# Patient Record
Sex: Female | Born: 1970 | Race: White | Hispanic: No | Marital: Married | Smoking: Never smoker
Health system: Southern US, Community
[De-identification: ages and names within clinical notes are randomized; demographics above are authoritative.]

## PROBLEM LIST (undated history)

## (undated) DIAGNOSIS — F419 Anxiety disorder, unspecified: Secondary | ICD-10-CM

## (undated) DIAGNOSIS — R45851 Suicidal ideations: Secondary | ICD-10-CM

## (undated) DIAGNOSIS — A64 Unspecified sexually transmitted disease: Secondary | ICD-10-CM

## (undated) DIAGNOSIS — G4700 Insomnia, unspecified: Secondary | ICD-10-CM

## (undated) DIAGNOSIS — R55 Syncope and collapse: Secondary | ICD-10-CM

## (undated) DIAGNOSIS — F329 Major depressive disorder, single episode, unspecified: Secondary | ICD-10-CM

## (undated) HISTORY — DX: Insomnia, unspecified: G47.00

## (undated) HISTORY — DX: Anxiety disorder, unspecified: F41.9

## (undated) HISTORY — DX: Major depressive disorder, single episode, unspecified: F32.9

## (undated) HISTORY — DX: Syncope and collapse: R55

## (undated) HISTORY — DX: Suicidal ideations: R45.851

## (undated) HISTORY — DX: Unspecified sexually transmitted disease: A64

---
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
## 2001-11-10 ENCOUNTER — Encounter: Payer: Self-pay | Admitting: Emergency Medicine

## 2001-11-10 ENCOUNTER — Emergency Department (HOSPITAL_COMMUNITY): Admission: EM | Admit: 2001-11-10 | Discharge: 2001-11-10 | Payer: Self-pay | Admitting: Emergency Medicine

## 2002-02-02 ENCOUNTER — Inpatient Hospital Stay (HOSPITAL_COMMUNITY): Admission: EM | Admit: 2002-02-02 | Discharge: 2002-02-03 | Payer: Self-pay | Admitting: Emergency Medicine

## 2002-02-09 ENCOUNTER — Emergency Department (HOSPITAL_COMMUNITY): Admission: EM | Admit: 2002-02-09 | Discharge: 2002-02-09 | Payer: Self-pay | Admitting: Emergency Medicine

## 2002-06-17 DIAGNOSIS — R45851 Suicidal ideations: Secondary | ICD-10-CM

## 2002-06-17 DIAGNOSIS — F32A Depression, unspecified: Secondary | ICD-10-CM

## 2002-06-17 HISTORY — DX: Depression, unspecified: F32.A

## 2002-06-17 HISTORY — DX: Suicidal ideations: R45.851

## 2003-06-18 HISTORY — PX: ABDOMINAL HYSTERECTOMY: SHX81

## 2006-04-24 ENCOUNTER — Ambulatory Visit: Payer: Self-pay | Admitting: Internal Medicine

## 2006-05-15 ENCOUNTER — Ambulatory Visit: Payer: Self-pay | Admitting: Internal Medicine

## 2006-08-26 ENCOUNTER — Ambulatory Visit: Payer: Self-pay | Admitting: Internal Medicine

## 2006-08-28 ENCOUNTER — Ambulatory Visit: Payer: Self-pay | Admitting: Internal Medicine

## 2006-12-04 DIAGNOSIS — F39 Unspecified mood [affective] disorder: Secondary | ICD-10-CM | POA: Insufficient documentation

## 2006-12-04 DIAGNOSIS — F419 Anxiety disorder, unspecified: Secondary | ICD-10-CM

## 2006-12-11 ENCOUNTER — Telehealth (INDEPENDENT_AMBULATORY_CARE_PROVIDER_SITE_OTHER): Payer: Self-pay | Admitting: *Deleted

## 2007-03-25 ENCOUNTER — Encounter: Payer: Self-pay | Admitting: Family Medicine

## 2007-03-25 ENCOUNTER — Telehealth (INDEPENDENT_AMBULATORY_CARE_PROVIDER_SITE_OTHER): Payer: Self-pay | Admitting: *Deleted

## 2007-03-25 ENCOUNTER — Telehealth: Payer: Self-pay | Admitting: Family Medicine

## 2007-04-23 ENCOUNTER — Telehealth: Payer: Self-pay | Admitting: Family Medicine

## 2007-05-18 ENCOUNTER — Telehealth (INDEPENDENT_AMBULATORY_CARE_PROVIDER_SITE_OTHER): Payer: Self-pay | Admitting: *Deleted

## 2007-07-13 ENCOUNTER — Telehealth: Payer: Self-pay | Admitting: Family Medicine

## 2007-07-15 ENCOUNTER — Telehealth (INDEPENDENT_AMBULATORY_CARE_PROVIDER_SITE_OTHER): Payer: Self-pay | Admitting: *Deleted

## 2007-07-20 ENCOUNTER — Telehealth (INDEPENDENT_AMBULATORY_CARE_PROVIDER_SITE_OTHER): Payer: Self-pay | Admitting: *Deleted

## 2007-07-29 ENCOUNTER — Ambulatory Visit: Payer: Self-pay | Admitting: Internal Medicine

## 2007-07-29 DIAGNOSIS — F4321 Adjustment disorder with depressed mood: Secondary | ICD-10-CM

## 2007-08-25 ENCOUNTER — Telehealth: Payer: Self-pay | Admitting: Internal Medicine

## 2007-09-21 ENCOUNTER — Telehealth (INDEPENDENT_AMBULATORY_CARE_PROVIDER_SITE_OTHER): Payer: Self-pay | Admitting: *Deleted

## 2007-10-08 ENCOUNTER — Encounter (INDEPENDENT_AMBULATORY_CARE_PROVIDER_SITE_OTHER): Payer: Self-pay | Admitting: *Deleted

## 2007-10-20 ENCOUNTER — Telehealth (INDEPENDENT_AMBULATORY_CARE_PROVIDER_SITE_OTHER): Payer: Self-pay | Admitting: *Deleted

## 2007-11-17 ENCOUNTER — Telehealth (INDEPENDENT_AMBULATORY_CARE_PROVIDER_SITE_OTHER): Payer: Self-pay | Admitting: *Deleted

## 2007-12-04 ENCOUNTER — Telehealth: Payer: Self-pay | Admitting: Internal Medicine

## 2007-12-14 ENCOUNTER — Telehealth: Payer: Self-pay | Admitting: Family Medicine

## 2008-01-12 ENCOUNTER — Telehealth (INDEPENDENT_AMBULATORY_CARE_PROVIDER_SITE_OTHER): Payer: Self-pay | Admitting: *Deleted

## 2008-02-12 ENCOUNTER — Ambulatory Visit: Payer: Self-pay | Admitting: Internal Medicine

## 2008-02-12 ENCOUNTER — Telehealth (INDEPENDENT_AMBULATORY_CARE_PROVIDER_SITE_OTHER): Payer: Self-pay | Admitting: *Deleted

## 2008-02-29 ENCOUNTER — Ambulatory Visit: Payer: Self-pay | Admitting: Family Medicine

## 2008-02-29 DIAGNOSIS — H10029 Other mucopurulent conjunctivitis, unspecified eye: Secondary | ICD-10-CM

## 2008-03-01 ENCOUNTER — Telehealth: Payer: Self-pay | Admitting: Family Medicine

## 2008-03-08 ENCOUNTER — Telehealth: Payer: Self-pay | Admitting: Family Medicine

## 2008-03-08 ENCOUNTER — Ambulatory Visit: Payer: Self-pay

## 2008-04-08 ENCOUNTER — Telehealth: Payer: Self-pay | Admitting: Internal Medicine

## 2008-05-06 ENCOUNTER — Telehealth: Payer: Self-pay | Admitting: Internal Medicine

## 2008-05-20 ENCOUNTER — Ambulatory Visit: Payer: Self-pay | Admitting: Internal Medicine

## 2008-06-06 ENCOUNTER — Telehealth: Payer: Self-pay | Admitting: Internal Medicine

## 2008-06-20 ENCOUNTER — Ambulatory Visit: Payer: Self-pay | Admitting: Family Medicine

## 2008-06-20 DIAGNOSIS — J069 Acute upper respiratory infection, unspecified: Secondary | ICD-10-CM | POA: Insufficient documentation

## 2008-07-06 ENCOUNTER — Telehealth: Payer: Self-pay | Admitting: Internal Medicine

## 2008-07-06 ENCOUNTER — Ambulatory Visit: Payer: Self-pay | Admitting: Family Medicine

## 2008-07-06 DIAGNOSIS — L259 Unspecified contact dermatitis, unspecified cause: Secondary | ICD-10-CM

## 2008-08-05 ENCOUNTER — Telehealth (INDEPENDENT_AMBULATORY_CARE_PROVIDER_SITE_OTHER): Payer: Self-pay | Admitting: Internal Medicine

## 2008-08-05 ENCOUNTER — Telehealth: Payer: Self-pay | Admitting: Family Medicine

## 2008-08-11 ENCOUNTER — Telehealth: Payer: Self-pay | Admitting: Internal Medicine

## 2008-09-02 ENCOUNTER — Telehealth: Payer: Self-pay | Admitting: Internal Medicine

## 2008-09-29 ENCOUNTER — Telehealth: Payer: Self-pay | Admitting: Internal Medicine

## 2008-10-14 ENCOUNTER — Telehealth: Payer: Self-pay | Admitting: Internal Medicine

## 2008-11-09 ENCOUNTER — Telehealth: Payer: Self-pay | Admitting: Internal Medicine

## 2008-11-21 ENCOUNTER — Ambulatory Visit: Payer: Self-pay | Admitting: Internal Medicine

## 2008-12-09 ENCOUNTER — Telehealth (INDEPENDENT_AMBULATORY_CARE_PROVIDER_SITE_OTHER): Payer: Self-pay | Admitting: *Deleted

## 2009-02-01 ENCOUNTER — Telehealth: Payer: Self-pay | Admitting: Internal Medicine

## 2009-02-21 ENCOUNTER — Encounter: Payer: Self-pay | Admitting: Internal Medicine

## 2009-02-28 ENCOUNTER — Telehealth: Payer: Self-pay | Admitting: Internal Medicine

## 2009-04-05 ENCOUNTER — Telehealth: Payer: Self-pay | Admitting: Internal Medicine

## 2009-04-07 ENCOUNTER — Emergency Department: Payer: Self-pay | Admitting: Emergency Medicine

## 2009-04-14 ENCOUNTER — Ambulatory Visit: Payer: Self-pay | Admitting: Family Medicine

## 2009-04-14 ENCOUNTER — Ambulatory Visit: Payer: Self-pay | Admitting: Internal Medicine

## 2009-04-14 DIAGNOSIS — J209 Acute bronchitis, unspecified: Secondary | ICD-10-CM | POA: Insufficient documentation

## 2009-04-14 DIAGNOSIS — R05 Cough: Secondary | ICD-10-CM

## 2009-04-17 ENCOUNTER — Telehealth: Payer: Self-pay | Admitting: Family Medicine

## 2009-05-05 ENCOUNTER — Telehealth: Payer: Self-pay | Admitting: Internal Medicine

## 2009-05-31 ENCOUNTER — Ambulatory Visit: Payer: Self-pay | Admitting: Internal Medicine

## 2009-06-02 LAB — CONVERTED CEMR LAB
ALT: 16 units/L (ref 0–35)
AST: 26 units/L (ref 0–37)
Albumin: 4.2 g/dL (ref 3.5–5.2)
Alkaline Phosphatase: 65 units/L (ref 39–117)
BUN: 10 mg/dL (ref 6–23)
Basophils Absolute: 0 10*3/uL (ref 0.0–0.1)
Basophils Relative: 0.8 % (ref 0.0–3.0)
Bilirubin, Direct: 0 mg/dL (ref 0.0–0.3)
CO2: 31 meq/L (ref 19–32)
Calcium: 9.8 mg/dL (ref 8.4–10.5)
Chloride: 102 meq/L (ref 96–112)
Creatinine, Ser: 1 mg/dL (ref 0.4–1.2)
Eosinophils Absolute: 0.2 10*3/uL (ref 0.0–0.7)
Eosinophils Relative: 4.6 % (ref 0.0–5.0)
GFR calc non Af Amer: 65.47 mL/min (ref >60)
Glucose, Bld: 81 mg/dL (ref 70–99)
HCT: 38.2 % (ref 36.0–46.0)
Hemoglobin: 12.9 g/dL (ref 12.0–15.0)
Lymphocytes Relative: 28.8 % (ref 12.0–46.0)
Lymphs Abs: 1.5 10*3/uL (ref 0.7–4.0)
MCHC: 33.8 g/dL (ref 30.0–36.0)
MCV: 95.3 fL (ref 78.0–100.0)
Monocytes Absolute: 0.4 10*3/uL (ref 0.1–1.0)
Monocytes Relative: 8.6 % (ref 3.0–12.0)
Neutro Abs: 3.1 10*3/uL (ref 1.4–7.7)
Neutrophils Relative %: 57.2 % (ref 43.0–77.0)
Phosphorus: 4.4 mg/dL (ref 2.3–4.6)
Platelets: 266 10*3/uL (ref 150.0–400.0)
Potassium: 4.1 meq/L (ref 3.5–5.1)
RBC: 4 M/uL (ref 3.87–5.11)
RDW: 12.1 % (ref 11.5–14.6)
Sodium: 139 meq/L (ref 135–145)
TSH: 1.11 microintl units/mL (ref 0.35–5.50)
Total Bilirubin: 0.8 mg/dL (ref 0.3–1.2)
Total Protein: 6.9 g/dL (ref 6.0–8.3)
WBC: 5.2 10*3/uL (ref 4.5–10.5)

## 2009-06-30 ENCOUNTER — Telehealth: Payer: Self-pay | Admitting: Internal Medicine

## 2009-08-28 ENCOUNTER — Telehealth: Payer: Self-pay | Admitting: Internal Medicine

## 2009-11-15 ENCOUNTER — Telehealth: Payer: Self-pay | Admitting: Internal Medicine

## 2010-02-05 ENCOUNTER — Telehealth: Payer: Self-pay | Admitting: Internal Medicine

## 2010-02-11 ENCOUNTER — Emergency Department: Payer: Self-pay | Admitting: Emergency Medicine

## 2010-02-28 ENCOUNTER — Ambulatory Visit: Payer: Self-pay | Admitting: Internal Medicine

## 2010-03-19 ENCOUNTER — Telehealth: Payer: Self-pay | Admitting: Internal Medicine

## 2010-04-02 ENCOUNTER — Ambulatory Visit: Payer: Self-pay | Admitting: Internal Medicine

## 2010-05-01 ENCOUNTER — Telehealth: Payer: Self-pay | Admitting: Internal Medicine

## 2010-05-28 ENCOUNTER — Telehealth: Payer: Self-pay | Admitting: Internal Medicine

## 2010-06-26 ENCOUNTER — Telehealth: Payer: Self-pay | Admitting: Internal Medicine

## 2010-07-05 ENCOUNTER — Ambulatory Visit: Admit: 2010-07-05 | Payer: Self-pay | Admitting: Internal Medicine

## 2010-07-13 ENCOUNTER — Ambulatory Visit: Admit: 2010-07-13 | Payer: Self-pay | Admitting: Internal Medicine

## 2010-07-17 NOTE — Assessment & Plan Note (Signed)
Summary: F/U,REFILL MEDS/CLE   Vital Signs:  Patient profile:   40 year old female Weight:      132 pounds BMI:     22.05 Temp:     98.4 degrees F oral Pulse rate:   68 / minute Pulse rhythm:   regular BP sitting:   110 / 78  (left arm) Cuff size:   regular  Vitals Entered By: Mervin Hack CMA Duncan Dull) (February 28, 2010 12:44 PM) CC: follow-up visit   History of Present Illness: "I think I may need some help"  Has gotten in with some people and started coke Started about 7 months ago No crack Using once or twice a week  Husband and children know Now can't see grandchildren without supervision Husband has cut off her access to money Not spending time with those poeple anymore  Mood otherwise was okay Not necessarily medicating a worsening of depression but feels like when she is high she doesn't have to worry about anything  still on fluoxetine xanax two times a day   No suicidal thoughts "I just feel like I want to go away"  Allergies: No Known Drug Allergies  Past History:  Past medical, surgical, family and social histories (including risk factors) reviewed for relevance to current acute and chronic problems.  Past Medical History: Reviewed history from 12/04/2006 and no changes required. Anxiety  1990 Depression 2004  Past Surgical History: Reviewed history from 12/04/2006 and no changes required. 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 Father died @82  Bladder cancer.  Had throat & prostate cancer also Mother: died @42  CVA x 2, HTN, obesity Siblings:  one brother No breast or colon cancer Mat GM-lung cancer  Social History: Reviewed history from 05/31/2009 and no changes required. Marital Status: Married Children: 2 daughters (before marriage--husband adopted) Unemployed at present Never Smoked Alcohol use-no  Physical  Exam  General:  alert.  NAD Psych:  dysphoric affect, tearful, and slightly anxious.     Impression & Recommendations:  Problem # 1:  DEPRESSION (ICD-311) Assessment Deteriorated  mood worse but doesn't seem like this prompted using the cocaine May be having a withdrawl effect that should ease as she abstains Needs help dealing with this will set up with Dr Laymond Purser No change in meds for now 25 minute visit--over half in counselling  Her updated medication list for this problem includes:    Alprazolam 1 Mg Tabs (Alprazolam) .Marland Kitchen... 1/2 to 1 two times a day as needed    Fluoxetine Hcl 20 Mg Caps (Fluoxetine hcl) .Marland Kitchen... 1 daily  Orders: Psychology Referral (Psychology)  Problem # 2:  ANXIETY (ICD-300.00) Assessment: Deteriorated will fill xanax today may need extra as she gives up the cocaine  Her updated medication list for this problem includes:    Alprazolam 1 Mg Tabs (Alprazolam) .Marland Kitchen... 1/2 to 1 two times a day as needed    Fluoxetine Hcl 20 Mg Caps (Fluoxetine hcl) .Marland Kitchen... 1 daily  Complete Medication List: 1)  Alprazolam 1 Mg Tabs (Alprazolam) .... 1/2 to 1 two times a day as needed 2)  Fluoxetine Hcl 20 Mg Caps (Fluoxetine hcl) .Marland Kitchen.. 1 daily  Patient Instructions: 1)  Please schedule a follow-up appointment in 3-4 weeks.  2)  Referral Appointment Information 3)  Day/Date: 4)  Time: 5)  Place/MD: 6)  Address: 7)  Phone/Fax: 8)  Patient given appointment information. Information/Orders faxed/mailed. Prescriptions: ALPRAZOLAM 1  MG  TABS (ALPRAZOLAM) 1/2 to 1 two times a day as needed  #60 x 0   Entered and Authorized by:   Cindee Salt MD   Signed by:   Cindee Salt MD on 02/28/2010   Method used:   Print then Give to Patient   RxID:   [id number]   Current Allergies (reviewed today): No known allergies   Appended Document: F/U,REFILL MEDS/CLE called in rx for XANAX to [facility] [address], Dr.Letvak auth early refill

## 2010-07-17 NOTE — Assessment & Plan Note (Signed)
Summary: f/u on med/alc   Vital Signs:  Patient profile:   40 year old female Weight:      142 pounds Temp:     98.1 degrees F oral BP sitting:   108 / 70  (left arm) Cuff size:   regular  Vitals Entered By: Mervin Hack CMA [facility] Dull) (April 02, 2010 10:08 AM) CC: follow-up visit   History of Present Illness: Doing okay Has not done any cocaine since the last visit "It has not been as hard to not do anything as I thought" Has stayed away from the "friends" that she was doing the drugs with Family is supportive---now can have her grandkids overnight Husband has still ben angry and worried that she is still doing the cocaine  Didn't see Dr Donnal Debar insurance and wanted to see if she could stop on her own Mood has been okay  Hasn't been able to sleep  has had to use 2 xanax at night to sleep "I can't wind down"---too much on her mind and was used to staying up late when using cocaine 1-2 times per week  Still using the xanax regularly two times a day    Allergies (verified): 1)  ! Vicodin (Hydrocodone-Acetaminophen) 2)  ! Percocet (Oxycodone-Acetaminophen)  Past History:  Past medical, surgical, family and social histories (including risk factors) reviewed for relevance to current acute and chronic problems.  Past Medical History: Reviewed history from 12/04/2006 and no changes required. Anxiety  1990 Depression 2004  Past Surgical History: Reviewed history from 12/04/2006 and no changes required. 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 Father died @82  Bladder cancer.  Had throat & prostate cancer also Mother: died @42  CVA x 2, HTN, obesity Siblings:  one brother No breast or colon cancer Mat GM-lung cancer  Social History: Reviewed history from 05/31/2009 and no changes required. Marital Status: Married Children: 2 daughters (before  marriage--husband adopted) Unemployed at present Never Smoked Alcohol use-no  Review of Systems       appetite is about the same weight is up 10#  Physical Exam  General:  alert and normal appearance.   Psych:  normally interactive, good eye contact, not anxious appearing, and not depressed appearing.     Impression & Recommendations:  Problem # 1:  ANXIETY (ICD-300.00) Assessment Improved has been overusing the xanax to be able to sleep  P: use xanax only in day     add trazodone      has been off cocaine since last visit  Her updated medication list for this problem includes:    Alprazolam 1 Mg Tabs (Alprazolam) .Marland Kitchen... 1/2 to 1 two times a day as needed    Fluoxetine Hcl 20 Mg Caps (Fluoxetine hcl) .Marland Kitchen... 1 daily    Trazodone Hcl 100 Mg Tabs (Trazodone hcl) .Marland Kitchen... 1 tab at bedtime to help sleep  Complete Medication List: 1)  Alprazolam 1 Mg Tabs (Alprazolam) .... 1/2 to 1 two times a day as needed 2)  Fluoxetine Hcl 20 Mg Caps (Fluoxetine hcl) .Marland Kitchen.. 1 daily 3)  Trazodone Hcl 100 Mg Tabs (Trazodone hcl) .Marland Kitchen.. 1 tab at bedtime to help sleep  Patient Instructions: 1)  Please try 1/2 of a trazodone tonight and tomorrow night. If not sleeping well then, increase to a full tab at bedtime. If still not sleeping after another 2-3 nights, may increase to 1 & 1/2 tabs at bedtime 2)  Please schedule a follow-up appointment in 3 months .  Prescriptions: TRAZODONE HCL 100 MG TABS (TRAZODONE HCL) 1 tab at bedtime to help sleep  #30 x 3   Entered and Authorized by:   Cindee Salt MD   Signed by:   Cindee Salt MD on 04/02/2010   Method used:   Electronically to        [facility] [address])       [address]       [place], [place]  [address]       Ph: [phone]       Fax: [phone]   RxID:   [id number] ALPRAZOLAM 1 MG  TABS (ALPRAZOLAM) 1/2 to 1 two times a day as needed MAY FILL EARLY NOW  #60 x 0   Entered  and Authorized by:   Cindee Salt MD   Signed by:   Cindee Salt MD on 04/02/2010   Method used:   Print then Give to Patient   RxID:   [id number]    Orders Added: 1)  Est. Patient Level III [38756]    Current Allergies (reviewed today): ! VICODIN (HYDROCODONE-ACETAMINOPHEN) ! PERCOCET (OXYCODONE-ACETAMINOPHEN)

## 2010-07-17 NOTE — Progress Notes (Signed)
Summary: ALPRAZOLAM  Phone Note Refill Request Message from:  [phone] on August 28, 2009 9:06 AM  Refills Requested: Medication #1:  ALPRAZOLAM 1 MG  TABS 1/2 to 1 two times a day as needed   Last Refilled: 07/30/2009 E-Scribe Request    Method Requested: Telephone to Pharmacy Initial call taken by: Mervin Hack CMA Duncan Dull),  August 28, 2009 9:06 AM  Follow-up for Phone Call        okay #60 x 2 Follow-up by: Cindee Salt MD,  August 28, 2009 1:37 PM  Additional Follow-up for Phone Call Additional follow up Details #1::        Rx called to pharmacy Additional Follow-up by: Mervin Hack CMA [facility] Dull),  August 28, 2009 2:48 PM    Prescriptions: ALPRAZOLAM 1 MG  TABS (ALPRAZOLAM) 1/2 to 1 two times a day as needed  #60 x 2   Entered by:   Mervin Hack CMA (AAMA)   Authorized by:   Cindee Salt MD   Signed by:   Mervin Hack CMA (AAMA) on 08/28/2009   Method used:   Telephoned to ...       [facility] [address]       [address]       [place], [place]  [address]       Ph: [phone]       Fax: [phone]   RxID:   [id number]

## 2010-07-17 NOTE — Progress Notes (Signed)
Summary: ALPRAZOLAM   Phone Note [facility]ill Request [phone] on February 05, 2010 9:16 AM  Refills Requested: Medication #1:  ALPRAZOLAM 1 MG  TABS 1/2 to 1 two times a day as needed   Last Refilled: 01/08/2010 E-Scribe Request    Method Requested: Telephone to Pharmacy Initial call taken by: Mervin Hack CMA Duncan Dull),  February 05, 2010 9:17 AM  Follow-up for Phone Call        okay #60 x 0 she needs to schedule a follow up appt Follow-up by: Cindee Salt MD,  February 05, 2010 1:40 PM  Additional Follow-up for Phone Call Additional follow up Details #1::        Rx called to pharmacy, [facility] at home that pt would need schedule follow-up for further refills Additional Follow-up by: DeShannon Katrinka Blazing CMA [facility] Dull),  February 05, 2010 3:18 PM    Prescriptions: ALPRAZOLAM 1 MG  TABS (ALPRAZOLAM) 1/2 to 1 two times a day as needed  #60 x 0   Entered by:   Mervin Hack CMA (AAMA)   Authorized by:   Cindee Salt MD   Signed by:   Mervin Hack CMA (AAMA) on 02/05/2010   Method used:   Telephoned to ...       [facility] [address]       [address]       [place], [place]  [address]       Ph: [phone]       Fax: [phone]   RxID:   [id number]

## 2010-07-17 NOTE — Progress Notes (Signed)
Summary: ALPRAZOLAM  Phone Note Refill Request Message from:  Patient on March 19, 2010 11:08 AM  Refills Requested: Medication #1:  ALPRAZOLAM 1 MG  TABS 1/2 to 1 two times a day as needed   Last Refilled: 02/28/2010 Pt calling asking for early refill on rx, pt has appt on Wednesday. Please advise, rx should be sent to [id number]   Method Requested: Telephone to Pharmacy Initial call taken by: DeShannon Katrinka Blazing CMA Duncan Dull),  March 19, 2010 11:09 AM  Follow-up for Phone Call        Okay #60 x 0 Make sure she is coming in for the follow up appt Follow-up by: Cindee Salt MD,  March 19, 2010 1:43 PM  Additional Follow-up for Phone Call Additional follow up Details #1::        Rx called to pharmacy, pt states she's coming Additional Follow-up by: Mervin Hack CMA Duncan Dull),  March 19, 2010 3:16 PM    Prescriptions: ALPRAZOLAM 1 MG  TABS (ALPRAZOLAM) 1/2 to 1 two times a day as needed  #60 x 0   Entered by:   Mervin Hack CMA (AAMA)   Authorized by:   Cindee Salt MD   Signed by:   Mervin Hack CMA (AAMA) on 03/19/2010   Method used:   Telephoned to ...       [facility] [address]       [address]       [place], [place]  [address]       Ph: [phone]       Fax: [phone]   RxID:   [id number]

## 2010-07-17 NOTE — Progress Notes (Signed)
Summary:  ALPRAZOLAM  Phone Note [facility]ill [facility]  [phone] on November 15, 2009 9:12 AM  Refills Requested: Medication #1:  ALPRAZOLAM 1 MG  TABS 1/2 to 1 two times a day as needed   Last Refilled: 10/20/2009 E-Scribe Request    Method Requested: Telephone to Pharmacy Initial call taken by: Mervin Hack CMA Duncan Dull),  November 15, 2009 9:12 AM  Follow-up for Phone Call        okay #60 x 2 Follow-up by: Cindee Salt MD,  November 15, 2009 11:58 AM  Additional Follow-up for Phone Call Additional follow up Details #1::        Rx called to pharmacy Additional Follow-up by: DeShannon Smith CMA Duncan Dull),  November 15, 2009 12:55 PM    Prescriptions: ALPRAZOLAM 1 MG  TABS (ALPRAZOLAM) 1/2 to 1 two times a day as needed  #60 x 2   Entered by:   Mervin Hack CMA (AAMA)   Authorized by:   Cindee Salt MD   Signed by:   Mervin Hack CMA (AAMA) on 11/15/2009   Method used:   Telephoned to ...       [facility] [address]       [address]       [place], [place]  [address]       Ph: [phone]       Fax: [phone]   RxID:   [id number]

## 2010-07-17 NOTE — Progress Notes (Signed)
Summary:  ALPRAZOLAM  Phone Note [facility]ill Request [facility]:  [phone] on June 30, 2009 5:18 PM  Refills Requested: Medication #1:  ALPRAZOLAM 1 MG  TABS 1/2 to 1 two times a day as needed   Last Refilled: 06/02/2009 E-Scribe Request    Method Requested: Telephone to Pharmacy Initial call taken by: Mervin Hack CMA Duncan Dull),  June 30, 2009 5:19 PM  Follow-up for Phone Call        she got 2 refills 1 month ago  please check with the pharmacy Follow-up by: Cindee Salt MD,  June 30, 2009 5:32 PM  Additional Follow-up for Phone Call Additional follow up Details #1::        spoke with pharmacist and pt does have refills Additional Follow-up by: Mervin Hack CMA Duncan Dull),  June 30, 2009 6:01 PM    Additional Follow-up for Phone Call Additional follow up Details #2::    noted Follow-up by: Cindee Salt MD,  June 30, 2009 7:05 PM

## 2010-07-17 NOTE — Progress Notes (Signed)
Summary:  ALPRAZOLAM   Phone Note Refill Request Message from:  [facility] [phone] on May 01, 2010 11:19 AM  Refills Requested: Medication #1:  ALPRAZOLAM 1 MG  TABS 1/2 to 1 two times a day as needed   Last Refilled: 04/02/2010 E-Scribe Request    Method Requested: Telephone to Pharmacy Initial call taken by: Mervin Hack CMA Duncan Dull),  May 01, 2010 11:19 AM  Follow-up for Phone Call        okay #60 x 0 Follow-up by: Cindee Salt MD,  May 01, 2010 1:47 PM  Additional Follow-up for Phone Call Additional follow up Details #1::        Rx called to pharmacy Additional Follow-up by: DeShannon Katrinka Blazing CMA Duncan Dull),  May 01, 2010 4:09 PM    Prescriptions: ALPRAZOLAM 1 MG  TABS (ALPRAZOLAM) 1/2 to 1 two times a day as needed  #60 x 0   Entered by:   Mervin Hack CMA (AAMA)   Authorized by:   Cindee Salt MD   Signed by:   Mervin Hack CMA (AAMA) on 05/01/2010   Method used:   Telephoned to ...       [facility] [address]       [address]       [place], [place]  [address]       Ph: [phone]       Fax: [phone]   RxID:   [id number]

## 2010-07-17 NOTE — Letter (Signed)
Summary: Work Dietitian at [employer]  [address] [place], [place] [address]   Phone: [phone]  Fax: [phone]    Today's Date: February 28, 2010  Name of Patient: Alexis Pierce  The above named patient had a medical visit today at: 12:15 pm.  Please take this into consideration when reviewing the time away from work.  Special Instructions:  [ X ] None  [  ] To be off the remainder of today, returning to the normal work / school schedule tomorrow.  [  ] To be off until the next scheduled appointment on ______________________.  [  ] Other ________________________________________________________________ ________________________________________________________________________   Sincerely yours,     Tillman Abide, MD

## 2010-07-17 NOTE — Progress Notes (Signed)
Summary: OK to use same bottle of eye drops for both eyes?  Phone Note Call from Patient Call back at Home Phone [phone] Call back at [phone]   Caller: Patient Call For: dr copeland Summary of Call: Pt was seen for conjuctivitis, now she says it is in her right eye.  Has the medicine, asks if ok to use in both  eyes Initial call taken by: Lowella Petties,  March 01, 2008 2:01 PM  Follow-up for Phone Call        yes. OK for both eyes, not uncommon to spread to other one. Follow-up by: Hannah Beat MD,  March 01, 2008 2:10 PM  Additional Follow-up for Phone Call Additional follow up Details #1::        advised pt Additional Follow-up by: Lowella Petties,  March 01, 2008 2:16 PM

## 2010-07-19 NOTE — Progress Notes (Signed)
Summary: ALPRAZOLAM  Phone Note [facility]  [phone] on May 28, 2010 11:57 AM  Refills Requested: Medication #1:  ALPRAZOLAM 1 MG  TABS 1/2 to 1 two times a day as needed   Last Refilled: 05/01/2010 E-Scribe Request    Method Requested: Telephone to Pharmacy Initial call taken by: Mervin Hack CMA Duncan Dull),  May 28, 2010 11:57 AM  Follow-up for Phone Call        okay #60 x 0 Follow-up by: Cindee Salt MD,  May 28, 2010 1:19 PM  Additional Follow-up for Phone Call Additional follow up Details #1::        Rx called to pharmacy Additional Follow-up by: Mervin Hack CMA Duncan Dull),  May 28, 2010 2:23 PM    Prescriptions: ALPRAZOLAM 1 MG  TABS (ALPRAZOLAM) 1/2 to 1 two times a day as needed  #60 x 0   Entered by:   Mervin Hack CMA (AAMA)   Authorized by:   Cindee Salt MD   Signed by:   Mervin Hack CMA (AAMA) on 05/28/2010   Method used:   Telephoned to ...       [facility] [address]       [address]       [place], [place]  [address]       Ph: [phone]       Fax: [phone]   RxID:   [id number]

## 2010-07-19 NOTE — Progress Notes (Signed)
Summary: ALPRAZOLAM/ FLUOXETINE  Phone Note [facility]ill Request [facility]  [phone] on June 26, 2010 4:05 PM  Refills Requested: Medication #1:  ALPRAZOLAM 1 MG  TABS 1/2 to 1 two times a day as needed   Last Refilled: 10/20/2009  Medication #2:  FLUOXETINE HCL 20 MG CAPS 1 daily   Last Refilled: 05/28/2010 E-Scribe Request    Method Requested: Telephone to Pharmacy Initial call taken by: Mervin Hack CMA Duncan Dull),  June 26, 2010 4:13 PM  Follow-up for Phone Call        okay fluoxetine for 1 year  xanax #60 x 0 Follow-up by: Cindee Salt MD,  June 27, 2010 1:39 PM  Additional Follow-up for Phone Call Additional follow up Details #1::        Rx called to pharmacy, Rx faxed to pharmacy Additional Follow-up by: DeShannon Katrinka Blazing CMA Duncan Dull),  June 27, 2010 3:27 PM    Prescriptions: ALPRAZOLAM 1 MG  TABS (ALPRAZOLAM) 1/2 to 1 two times a day as needed  #60 x 0   Entered by:   Mervin Hack CMA (AAMA)   Authorized by:   Cindee Salt MD   Signed by:   Mervin Hack CMA (AAMA) on 06/27/2010   Method used:   [facility]phoned to ...       [facility] [address]       [address]       [place], [place]  [address]       Ph: [phone]       Fax: [phone]   RxID:   [id number] FLUOXETINE HCL 20 MG CAPS (FLUOXETINE HCL) 1 daily  #90 x 3   Entered by:   Mervin Hack CMA (AAMA)   Authorized by:   Cindee Salt MD   Signed by:   Mervin Hack CMA (AAMA) on 06/27/2010   Method used:   [facility]ronically to        [facility] [address]       [address]       [place], [place]  [address]       Ph: [phone]       Fax: [phone]   RxID:   [id number]

## 2010-07-27 ENCOUNTER — Telehealth: Payer: Self-pay | Admitting: Internal Medicine

## 2010-08-02 NOTE — Progress Notes (Signed)
Summary: regarding xanax refill request  Phone Note Refill Request Call back at [phone] Message from:  Patient  Refills Requested: Medication #1:  ALPRAZOLAM 1 MG  TABS 1/2 to 1 two times a day as needed Pt's xanax was denied at pharmacy.  She called to ask why, I told her it could be because when she was seen in october she was told to follow up in 3 months but didnt.  She said she had an appt scheduled but had to cancel because she suddenly got 2 jobs, after being out of work for [employer], and was planning to come in when the newness of these jobs settle down.  Please advise.   [facility] [address]  Initial call taken by: Lowella Petties CMA, AAMA,  July 27, 2010 9:45 AM  Follow-up for Phone Call        pt no-showed 07/05/2010, pt canceled 07/13/2010 DeShannon Smith CMA (AAMA)  July 27, 2010 10:13 AM   Can fill #15 x 0 until her next appt Follow-up by: Cindee Salt MD,  July 27, 2010 1:49 PM  Additional Follow-up for Phone Call Additional follow up Details #1::        Spoke with patient and advised results. rx sent to pharmacy and appt made for OV Additional Follow-up by: DeShannon Smith CMA Duncan Dull),  July 27, 2010 2:59 PM    Prescriptions: ALPRAZOLAM 1 MG  TABS (ALPRAZOLAM) 1/2 to 1 two times a day as needed  #15 x 0   Entered by:   Mervin Hack CMA (AAMA)   Authorized by:   Cindee Salt MD   Signed by:   Mervin Hack CMA (AAMA) on 07/27/2010   Method used:   Telephoned to ...       [facility] [address]       [address], [address]       [place], [place]  [address]       Ph: [phone]       Fax: [phone]   RxID:   [id number]

## 2010-08-03 ENCOUNTER — Ambulatory Visit: Payer: Self-pay | Admitting: Internal Medicine

## 2010-08-03 DIAGNOSIS — Z0289 Encounter for other administrative examinations: Secondary | ICD-10-CM

## 2010-08-07 ENCOUNTER — Ambulatory Visit: Payer: Self-pay | Admitting: Internal Medicine

## 2010-08-09 ENCOUNTER — Encounter: Payer: Self-pay | Admitting: Internal Medicine

## 2010-08-09 ENCOUNTER — Ambulatory Visit (INDEPENDENT_AMBULATORY_CARE_PROVIDER_SITE_OTHER): Payer: Self-pay | Admitting: Internal Medicine

## 2010-08-09 DIAGNOSIS — L03319 Cellulitis of trunk, unspecified: Secondary | ICD-10-CM

## 2010-08-09 DIAGNOSIS — L02219 Cutaneous abscess of trunk, unspecified: Secondary | ICD-10-CM | POA: Insufficient documentation

## 2010-08-09 DIAGNOSIS — F329 Major depressive disorder, single episode, unspecified: Secondary | ICD-10-CM

## 2010-08-09 DIAGNOSIS — F411 Generalized anxiety disorder: Secondary | ICD-10-CM

## 2010-08-14 NOTE — Assessment & Plan Note (Signed)
Summary: MED REFILL/CLE   NO INSURANCE   Vital Signs:  Patient profile:   40 year old female Weight:      155 pounds Temp:     98.5 degrees F oral Pulse rate:   72 / minute Pulse rhythm:   regular BP sitting:   102 / 70  (left arm) Cuff size:   regular  Vitals Entered By: Mervin Hack CMA Duncan Dull) (August 09, 2010 2:44 PM) CC: medication refill, right eye redness, abcess on left side   History of Present Illness: Has inflammation in right eye took contact out some photophobia  Still using the xanax two times a day Not helped by the sleep med so needing xanax then Working second shift at [employer] --gets home at 11PM and then takes a while to wind down Gets up  ~8-8:30AM  Mood has been fine Doesn't miss coke at all--done with those people gaining the confidence of her husband  Has had sore on left flank for several days No fever painful tried squeezing and some yellow, green stuff came out similar area on foot last year---eventually healed and sloughed off   Allergies: 1)  ! Vicodin (Hydrocodone-Acetaminophen) 2)  ! Percocet (Oxycodone-Acetaminophen)  Past History:  Past medical, surgical, family and social histories (including risk factors) reviewed for relevance to current acute and chronic problems.  Past Medical History: Reviewed history from 12/04/2006 and no changes required. Anxiety  1990 Depression 2004  Past Surgical History: Reviewed history from 12/04/2006 and no changes required. 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 Father died @82  Bladder cancer.  Had throat & prostate cancer also Mother: died @42  CVA x 2, HTN, obesity Siblings:  one brother No breast or colon cancer Mat GM-lung cancer  Social History: Reviewed history from 05/31/2009 and no changes required. Marital Status: Married Children: 2 daughters (before  marriage--husband adopted) Unemployed at present Never Smoked Alcohol use-no  Review of Systems       Eating too much has been trying to walk but weight is up  Physical Exam  General:  alert and normal appearance.   Eyes:  conj injection on right (advised eye appt tomorrow if no better) Skin:  inflamed area on left flank  ~3cm  warm, tender pointing to tip but no open area Psych:  normally interactive, good eye contact, not anxious appearing, and not depressed appearing.     Impression & Recommendations:  Problem # 1:  CELLULITIS/ABSCESS, TRUNK (ICD-682.2) Assessment New  could be MRSA will treat with warm compresses  clindamycin  Her updated medication list for this problem includes:    Clindamycin Hcl 300 Mg Caps (Clindamycin hcl) .Marland Kitchen... 1 cap by mouth three times a day for skin infection  Problem # 2:  ANXIETY (ICD-300.00) Assessment: Improved doing okay will increase the trazodone so she can decrease the xanax use  Her updated medication list for this problem includes:    Alprazolam 1 Mg Tabs (Alprazolam) .Marland Kitchen... 1/2 to 1 two times a day as needed    Fluoxetine Hcl 20 Mg Caps (Fluoxetine hcl) .Marland Kitchen... 1 daily    Trazodone Hcl 100 Mg Tabs (Trazodone hcl) .Marland Kitchen... 2 tabs  at bedtime to help sleep  Problem # 3:  DEPRESSION (ICD-311) Assessment: Unchanged mood is okay needs to stay on the fluoxetine  Her updated medication list for this problem includes:    Alprazolam 1 Mg Tabs (Alprazolam) .Marland Kitchen... 1/2  to 1 two times a day as needed    Fluoxetine Hcl 20 Mg Caps (Fluoxetine hcl) .Marland Kitchen... 1 daily    Trazodone Hcl 100 Mg Tabs (Trazodone hcl) .Marland Kitchen... 2 tabs  at bedtime to help sleep  Complete Medication List: 1)  Alprazolam 1 Mg Tabs (Alprazolam) .... 1/2 to 1 two times a day as needed 2)  Fluoxetine Hcl 20 Mg Caps (Fluoxetine hcl) .Marland Kitchen.. 1 daily 3)  Trazodone Hcl 100 Mg Tabs (Trazodone hcl) .... 2 tabs  at bedtime to help sleep 4)  Clindamycin Hcl 300 Mg Caps (Clindamycin hcl)  .Marland Kitchen.. 1 cap by mouth three times a day for skin infection  Patient Instructions: 1)  Please schedule a follow-up appointment in 6 months .  Prescriptions: ALPRAZOLAM 1 MG  TABS (ALPRAZOLAM) 1/2 to 1 two times a day as needed  #60 x 0   Entered and Authorized by:   Cindee Salt MD   Signed by:   Cindee Salt MD on 08/09/2010   Method used:   Print then Give to Patient   RxID:   [id number] TRAZODONE HCL 100 MG TABS (TRAZODONE HCL) 2 tabs  at bedtime to help sleep  #90 x 2   Entered and Authorized by:   Cindee Salt MD   Signed by:   Cindee Salt MD on 08/09/2010   Method used:   Electronically to        [facility] [address] ([facility])       [address], [address]       [place], [place]  [address]       Ph: [phone]       Fax: [phone]   RxID:   [id number] CLINDAMYCIN HCL 300 MG CAPS (CLINDAMYCIN HCL) 1 cap by mouth three times a day for skin infection  #30 x 0   Entered and Authorized by:   Cindee Salt MD   Signed by:   Cindee Salt MD on 08/09/2010   Method used:   Electronically to        [facility] [address] (ret[facility])       [address], [address]       [place], [place]  [address]       Ph: [phone]       Fax: [phone]   RxID:   [id number]    Orders Added: 1)  Est. Patient Level IV [[address]]    Current Allergies (reviewed today): ! VICODIN (HYDROCODONE-ACETAMINOPHEN) ! PERCOCET (OXYCODONE-ACETAMINOPHEN)

## 2010-08-14 NOTE — Letter (Signed)
Summary: Out of Work  [facility]  [address] [place], [place] [address]   Phone: [phone]  Fax: [phone]    August 09, 2010   Employee:  Ardath Sax    To Whom It May Concern:   For Medical reasons, please excuse the above named employee from work for the following dates:  Start:   08/09/2010  End:   08/10/2010 returning back to work  If you need additional information, please feel free to contact our office.         Sincerely,      Tillman Abide, MD

## 2010-09-06 IMAGING — CR DG CHEST 2V
1 series · 2 of 2 positions shown · non-contrast
Comparison: none

REASON FOR EXAM: cough/ dr. Zielony Zabek  fax [phone]  call [phone]
COMMENTS:

PROCEDURE:     DXR - DXR CHEST PA (OR AP) AND LATERAL  - April 14, 2009  [DATE]
RESULT:     Comparison: None

[Series 1: view not recorded · 0.17mm/px · 2 of 2 slices shown]
[im 1/2]
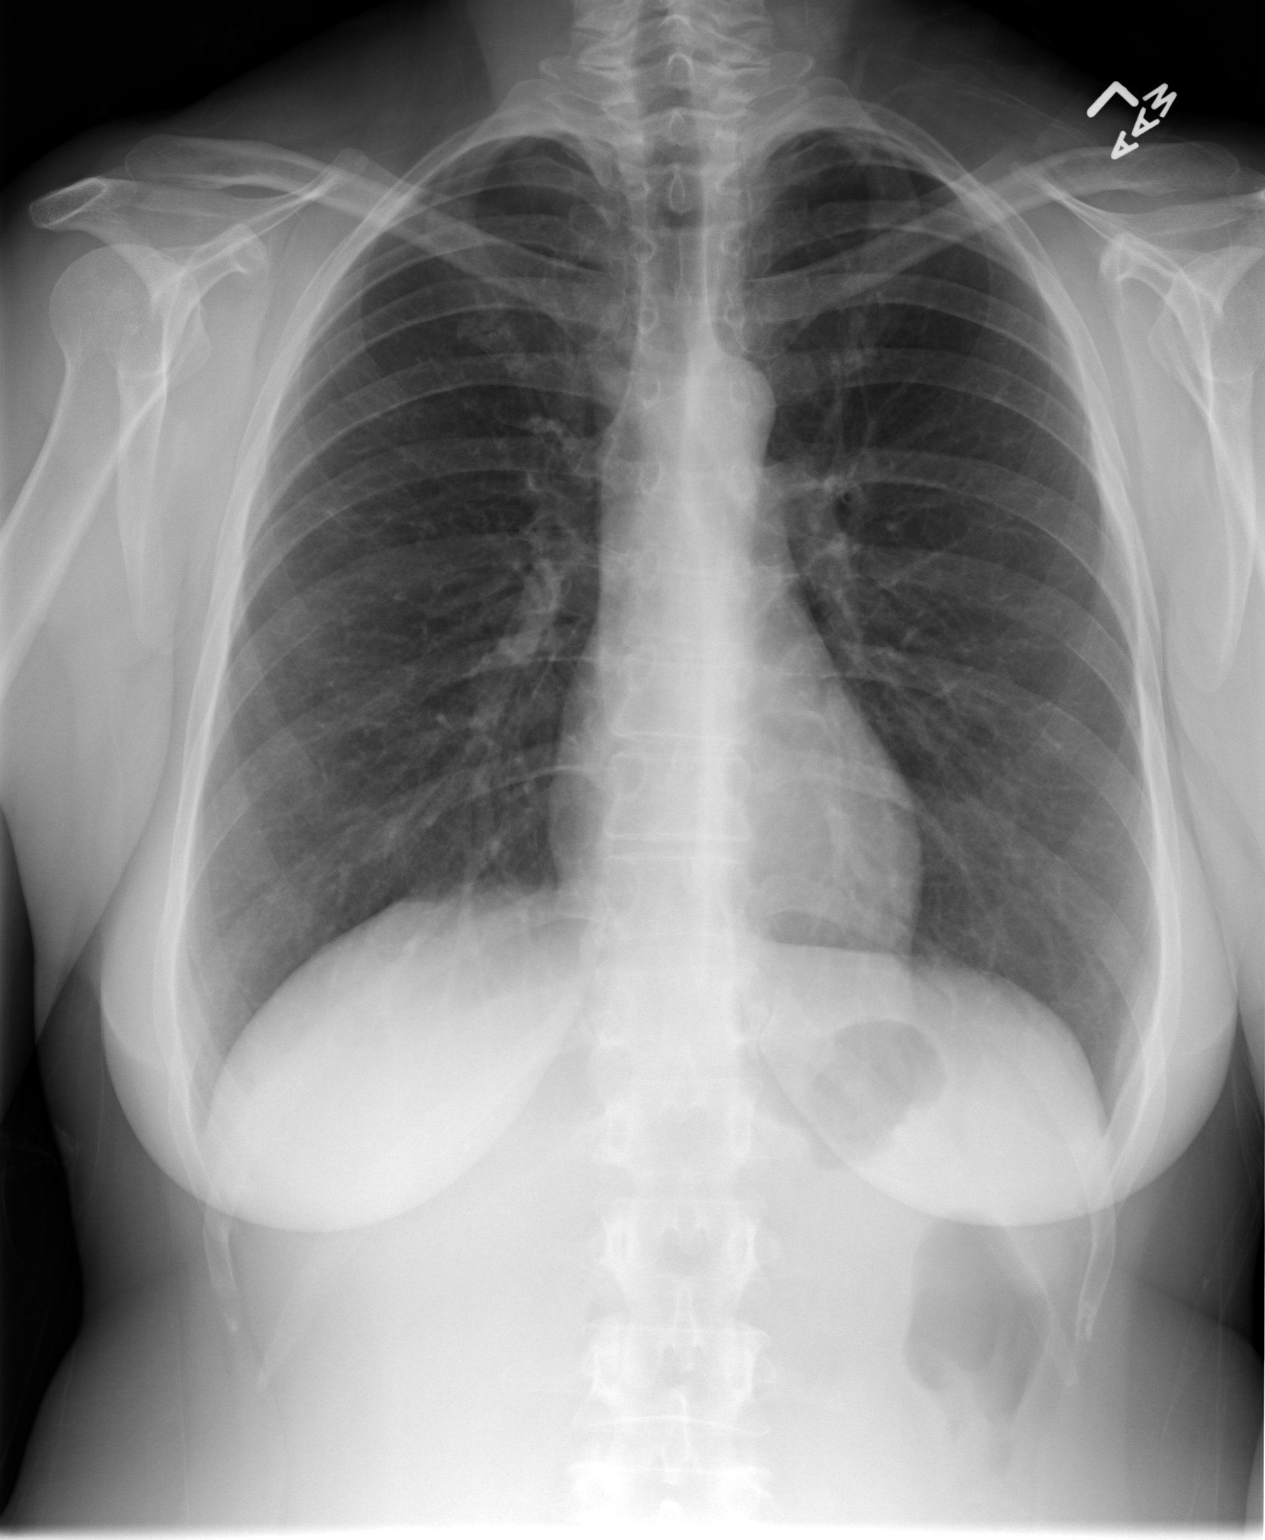
[im 2/2]
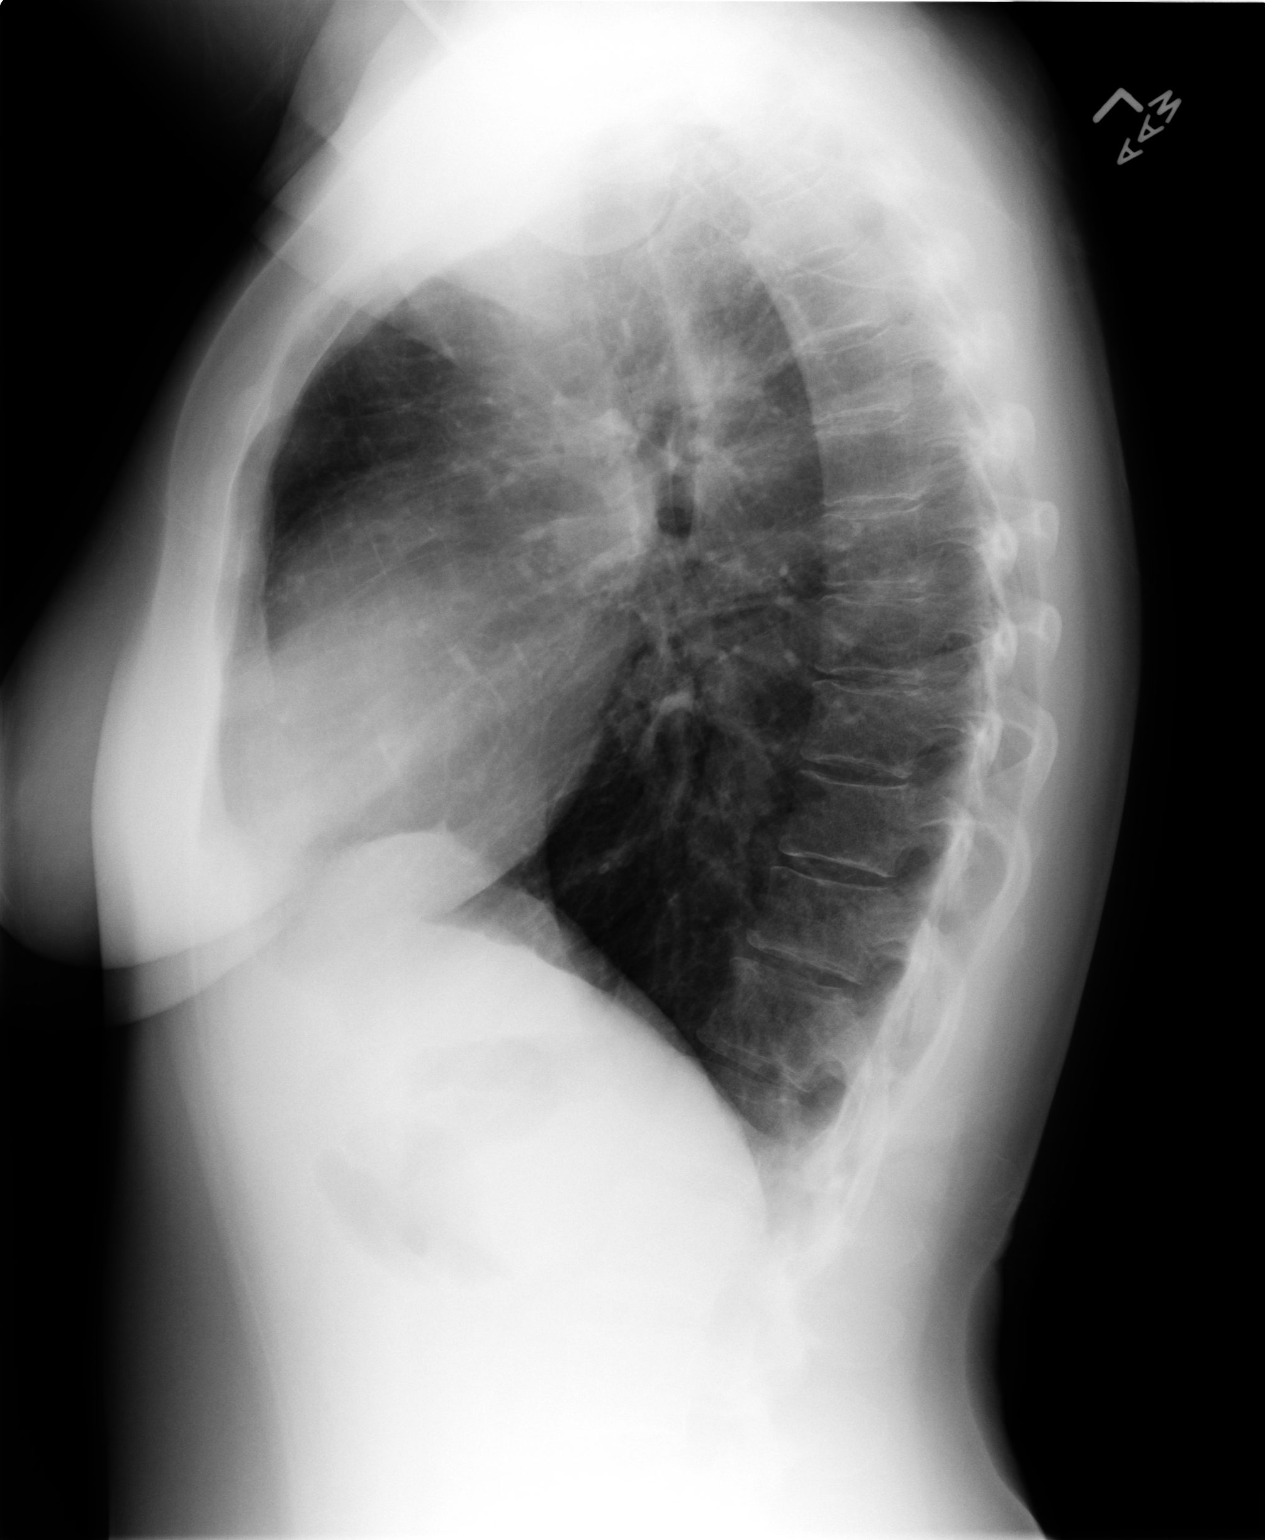

[2 of 2 positions shown; findings below may reference images not displayed]

FINDINGS: PA and lateral chest radiographs are provided. There is no focal parenchymal
opacity, pleural effusion, or pneumothorax. The heart and mediastinum are
unremarkable. The osseous structures are unremarkable.
IMPRESSION: No acute disease of the chest.

## 2010-09-21 ENCOUNTER — Other Ambulatory Visit: Payer: Self-pay | Admitting: Internal Medicine

## 2010-09-24 NOTE — Telephone Encounter (Signed)
Okay #60 x 0 

## 2010-09-24 NOTE — Telephone Encounter (Signed)
rx called into pharmacy

## 2010-09-24 NOTE — Telephone Encounter (Signed)
Ok to refill 

## 2010-10-30 ENCOUNTER — Other Ambulatory Visit: Payer: Self-pay | Admitting: Internal Medicine

## 2010-10-30 NOTE — Telephone Encounter (Signed)
Okay #60 x 0 

## 2010-10-30 NOTE — Telephone Encounter (Signed)
rx called into pharmacy

## 2010-11-02 NOTE — Discharge Summary (Signed)
NAMEVINIE, CHARITY NO.:  [id number]   MEDICAL RECORD NO.:  [id number]                   PATIENT TYPE:  INP   LOCATION:  0162                                 FACILITY:  [facility]   PHYSICIAN:  Lazaro Arms, MD          DATE OF BIRTH:  1970-12-29   DATE OF ADMISSION:  02/01/2002  DATE OF DISCHARGE:  02/03/2002                                 DISCHARGE SUMMARY   HISTORY OF PRESENT ILLNESS:  The patient is a 40 year old female who was  brought to the emergency room by her husband after she took a handful of  pills of her daughter's prescription medicines because she wanted everyone  to leave her alone.  She states that it was an impulsive gesture that was  not pre-thought out and that she did not want to kill herself.  She was  taken to the emergency room where they started charcoal.  The only thing  remarkable on her initial exam was a sinus tachycardia between 100 and 120.   PAST MEDICAL HISTORY:  She has no past medical history.   ALLERGIES:  She has no allergies.   MEDICATIONS:  She is on no medications.   SOCIAL HISTORY:  She is currently separated from her husband and has two  children with him.   FAMILY HISTORY:  Remarkable for a mother who died at 24 of a stroke and a  father and brother who are in good health.   REVIEW OF SYSTEMS:  Significant in that for the past several weeks she has  been extremely tearful and having insomnia dating back to trouble she is  having with her husband and their separation. The medications that she said  she had was Prilosec and some imipramines.   HOSPITAL COURSE:  She was admitted to the ICU and put on 1-to-1 and  telemetry because of the tachycardia.  She was also started on normal saline  with some bicarb to alkalinize her urine secondary to the tricyclics.  She  at no point had a prolongation of QT, and her QRS duration was also within  normal limits and not prolonged.  Over the next 24 hours,  her heart rate  come down to the 70s and the rest of her exam was within normal limits.  Her  tox screen was positive only for amphetamines although she did state that  she had taken some of her daughter's sinus medication which had some  phenylpropanolamine in it.  She was seen by Dr. Jeanie Sewer in psychiatry who  confirmed the diagnosis of adjustment disorder with depressed mood.  With  the significant stressors of a marital separation contributing, he felt that  she had the capacity to make her own decisions and did not consider her at  risk for a suicide, had recommended the psychiatric inpatient therapy  although the patient declined, and as she was not committable she is safe  for discharge.  The patient desires to have an outpatient followup with a  therapist.  The numbers and names of a therapist were given to her and she  will contact them upon discharge and make the appointment.  On August 20th,  she was in stable condition, pulse was 70-80, was afebrile, her exam was  within normal limits.   DISCHARGE DIAGNOSES:  1. Suicide attempt with multiple medications.  2. Adjustment disorder with depressed mood.   DISCHARGE PLAN:  She is to be discharged to home and she will make her own  followup appointment for a therapist, and she has agreed to use emergency  services on a p.r.n. basis if she were to consider harming herself or  others.                                               Lazaro Arms, MD    AC/MEDQ  D:  02/03/2002  T:  02/03/2002  Job:  [id number]   cc:   Haskel Khan, M.D.  [address]  [place]  [place] [address]

## 2010-11-30 ENCOUNTER — Other Ambulatory Visit: Payer: Self-pay | Admitting: Internal Medicine

## 2010-11-30 NOTE — Telephone Encounter (Signed)
rx called into pharmacy for Xanax

## 2010-11-30 NOTE — Telephone Encounter (Signed)
LETVAK PATIENT, ok to refill? 

## 2010-11-30 NOTE — Telephone Encounter (Signed)
Please phone in

## 2010-12-28 ENCOUNTER — Other Ambulatory Visit: Payer: Self-pay | Admitting: *Deleted

## 2010-12-28 MED ORDER — ALPRAZOLAM 1 MG PO TABS: ORAL_TABLET | ORAL | Status: DC

## 2010-12-28 NOTE — Telephone Encounter (Signed)
Faxed request from [facility] [address] is on your desk.

## 2010-12-28 NOTE — Telephone Encounter (Signed)
rx faxed to pharmacy manually, left message patient needs to be seen in a few months

## 2010-12-28 NOTE — Telephone Encounter (Signed)
Okay #60 x 0 Please have her set up follow up within the next couple of months

## 2011-01-01 NOTE — Telephone Encounter (Signed)
Pharmacy didn't get fax from 7/13, refill called into [facility] [address] today.

## 2011-01-24 ENCOUNTER — Encounter: Payer: Self-pay | Admitting: *Deleted

## 2011-01-24 ENCOUNTER — Ambulatory Visit (INDEPENDENT_AMBULATORY_CARE_PROVIDER_SITE_OTHER): Payer: Self-pay | Admitting: Internal Medicine

## 2011-01-24 ENCOUNTER — Encounter: Payer: Self-pay | Admitting: Internal Medicine

## 2011-01-24 VITALS — BP 110/70 | HR 90 | Temp 98.5°F | Ht 65.0 in | Wt 149.0 lb

## 2011-01-24 DIAGNOSIS — F411 Generalized anxiety disorder: Secondary | ICD-10-CM

## 2011-01-24 DIAGNOSIS — J019 Acute sinusitis, unspecified: Secondary | ICD-10-CM

## 2011-01-24 DIAGNOSIS — F3289 Other specified depressive episodes: Secondary | ICD-10-CM

## 2011-01-24 DIAGNOSIS — F329 Major depressive disorder, single episode, unspecified: Secondary | ICD-10-CM

## 2011-01-24 MED ORDER — AMOXICILLIN 500 MG PO TABS: 1000.0000 mg | ORAL_TABLET | Freq: Two times a day (BID) | ORAL | Status: AC

## 2011-01-24 NOTE — Assessment & Plan Note (Signed)
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

## 2011-01-24 NOTE — Progress Notes (Signed)
  Subjective:    Patient ID: Alexis Pierce, female    DOB: Nov 29, 1970, 40 y.o.   MRN: [id number]  HPI Overall doing fairly well The drug use is all over and her mood is fine Daughter and grandchild are now living with them (her boyfriend was not treating her right) Still on the prozac Only occ uses the trazodone---gets some joint stiffness in AM when she uses that Takes 1 xanax daily---occ needs 2nd one  Had eye infection a couple of weeks ago-thought it was related to contacts Used left over drops Eyes got better but also had congestion during that time Worsening facial pressure, ear pain, sore throat, drainage Slight chest symptoms from the drainage  No fever Energy levels are poor No true SOB but has to breathe deeper after talking for a while  Current Outpatient Prescriptions on File Prior to Visit  Medication Sig Dispense Refill  . ALPRAZolam (XANAX) 1 MG tablet Take 1/2 to one tablet by mouth twice daily as needed.  60 tablet  0    Allergies  Allergen Reactions  . Hydrocodone-Acetaminophen     REACTION: nausea  . Oxycodone-Acetaminophen     REACTION: nausea    Past Medical History  Diagnosis Date  . Anxiety 1990  . Depression 2004  . VD (venereal disease)     x 2  . Suicidal intent 2004    pills    Past Surgical History  Procedure Date  . Abdominal hysterectomy 2005    endometriosis    Family History  Problem Relation Age of Onset  . Hypertension Mother   . Stroke Mother     x 2  . Obesity Mother   . Cancer Father     bladder, throat, and prostate  . Cancer Maternal Grandmother     lung    History   Social History  . Marital Status: Married    Spouse Name: N/A    Number of Children: 2  . Years of Education: N/A   Occupational History  . Unemployed    Social History Main Topics  . Smoking status: Never Smoker   . Smokeless tobacco: Never Used  . Alcohol Use: No  . Drug Use: Not on file  . Sexually Active: Not on file   Other Topics  Concern  . Not on file   Social History Narrative   Patient signed designated party release form and gives Annali Lybrand (spouse) [phone] access to medical records.   Review of Systems Vomited a couple of times--relates to cough and drainage    Objective:   Physical Exam  Constitutional: She appears well-developed and well-nourished. No distress.  HENT:  Head: Normocephalic and atraumatic.  Mouth/Throat: Oropharynx is clear and moist. No oropharyngeal exudate.       Mild frontal and maxillary tenderness TMs normal Marked inflammation in nose--  L>R  Neck: Normal range of motion. Neck supple.  Pulmonary/Chest: Effort normal and breath sounds normal. No respiratory distress. She has no wheezes. She has no rales.  Lymphadenopathy:    She has no cervical adenopathy.  Psychiatric: She has a normal mood and affect. Her behavior is normal. Judgment and thought content normal.          Assessment & Plan:

## 2011-01-24 NOTE — Assessment & Plan Note (Signed)
Still using the xanax regularly

## 2011-01-24 NOTE — Assessment & Plan Note (Signed)
Doing better now Continues on the fluoxetine

## 2011-02-04 ENCOUNTER — Other Ambulatory Visit: Payer: Self-pay | Admitting: Internal Medicine

## 2011-02-04 NOTE — Telephone Encounter (Signed)
Okay #60 x 0 

## 2011-02-04 NOTE — Telephone Encounter (Signed)
Rx called to pharmacy

## 2011-02-04 NOTE — Telephone Encounter (Signed)
Received refill request electronically from pharmacy. Is it okay to refill medication? Please verify dispense amount and number of refills.

## 2011-03-05 ENCOUNTER — Other Ambulatory Visit: Payer: Self-pay | Admitting: Internal Medicine

## 2011-03-06 NOTE — Telephone Encounter (Signed)
Okay #60 x 0 

## 2011-03-06 NOTE — Telephone Encounter (Signed)
rx called into pharmacy

## 2011-04-04 ENCOUNTER — Other Ambulatory Visit: Payer: Self-pay | Admitting: Internal Medicine

## 2011-04-04 NOTE — Telephone Encounter (Signed)
Okay #60 x 0 

## 2011-04-04 NOTE — Telephone Encounter (Signed)
Received refill request electronically from pharmacy. Is it okay to refill medicaiton?

## 2011-04-04 NOTE — Telephone Encounter (Signed)
Rx called to pharmacy as instructed. 

## 2011-05-03 ENCOUNTER — Other Ambulatory Visit: Payer: Self-pay | Admitting: Internal Medicine

## 2011-05-03 NOTE — Telephone Encounter (Signed)
Okay #60 x 0 

## 2011-05-03 NOTE — Telephone Encounter (Signed)
rx called into pharmacy

## 2011-05-31 ENCOUNTER — Other Ambulatory Visit: Payer: Self-pay | Admitting: Internal Medicine

## 2011-05-31 NOTE — Telephone Encounter (Signed)
rx called into pharmacy

## 2011-05-31 NOTE — Telephone Encounter (Signed)
Dr. Letvak patient  

## 2011-05-31 NOTE — Telephone Encounter (Signed)
May phone in

## 2011-05-31 NOTE — Telephone Encounter (Signed)
rx called in earlier today, ok'd by Dr.G

## 2011-07-01 ENCOUNTER — Other Ambulatory Visit: Payer: Self-pay | Admitting: Internal Medicine

## 2011-07-01 NOTE — Telephone Encounter (Signed)
Okay #60 x 0 

## 2011-07-01 NOTE — Telephone Encounter (Signed)
rx called into pharmacy

## 2011-07-31 ENCOUNTER — Other Ambulatory Visit: Payer: Self-pay | Admitting: Internal Medicine

## 2011-07-31 NOTE — Telephone Encounter (Signed)
Okay #60 x 0 

## 2011-07-31 NOTE — Telephone Encounter (Signed)
rx called into pharmacy

## 2011-08-28 ENCOUNTER — Other Ambulatory Visit: Payer: Self-pay | Admitting: Internal Medicine

## 2011-08-28 NOTE — Telephone Encounter (Signed)
Please call in

## 2011-08-28 NOTE — Telephone Encounter (Signed)
OK to refill in Dr. Karle Starch absence? Lat filled 07/31/11 and last OV 8/12.

## 2011-08-29 NOTE — Telephone Encounter (Signed)
Rx called to [facility]

## 2011-09-30 ENCOUNTER — Other Ambulatory Visit: Payer: Self-pay | Admitting: *Deleted

## 2011-09-30 NOTE — Telephone Encounter (Signed)
Received refill request from pharmacy. Is it okay to refill medication? 

## 2011-10-01 MED ORDER — ALPRAZOLAM 1 MG PO TABS: 0.5000 mg | ORAL_TABLET | Freq: Two times a day (BID) | ORAL | Status: DC | PRN

## 2011-10-01 NOTE — Telephone Encounter (Signed)
Okay #60 x 0 

## 2011-10-01 NOTE — Telephone Encounter (Signed)
rx called into pharmacy

## 2011-11-01 ENCOUNTER — Ambulatory Visit (INDEPENDENT_AMBULATORY_CARE_PROVIDER_SITE_OTHER): Payer: Self-pay | Admitting: Internal Medicine

## 2011-11-01 ENCOUNTER — Encounter: Payer: Self-pay | Admitting: Internal Medicine

## 2011-11-01 VITALS — BP 110/70 | HR 89 | Temp 98.6°F | Wt 118.0 lb

## 2011-11-01 DIAGNOSIS — L03818 Cellulitis of other sites: Secondary | ICD-10-CM

## 2011-11-01 DIAGNOSIS — L03811 Cellulitis of head [any part, except face]: Secondary | ICD-10-CM | POA: Insufficient documentation

## 2011-11-01 MED ORDER — ALPRAZOLAM 1 MG PO TABS: 0.5000 mg | ORAL_TABLET | Freq: Two times a day (BID) | ORAL | Status: DC | PRN

## 2011-11-01 MED ORDER — CEPHALEXIN 500 MG PO CAPS: 500.0000 mg | ORAL_CAPSULE | Freq: Three times a day (TID) | ORAL | Status: AC

## 2011-11-01 NOTE — Assessment & Plan Note (Signed)
Related to bug bite May have bacterial sinus infection though not clear cut Will treat with keflex for $4 plan Consider clinda if doesn't clear

## 2011-11-01 NOTE — Progress Notes (Signed)
  Subjective:    Patient ID: Alexis Pierce, female    DOB: Oct 06, 1970, 41 y.o.   MRN: [id number]  HPI Has been congested for about 3 weeks Ear feels stopped up No fever No SOB Figured it was allergies Uses just benedryl---did try loratadine this AM  Noted a bite 3 days ago Now seems to have swollen gland on right Has itching also  Tried alcohol and has been scratching  Current Outpatient Prescriptions on File Prior to Visit  Medication Sig Dispense Refill  . ALPRAZolam (XANAX) 1 MG tablet Take 0.5-1 tablets (0.5-1 mg total) by mouth 2 (two) times daily as needed.  60 tablet  0  . FLUoxetine (PROZAC) 20 MG capsule Take 20 mg by mouth daily.        . traZODone (DESYREL) 100 MG tablet Take 200 mg by mouth at bedtime.          Allergies  Allergen Reactions  . Hydrocodone-Acetaminophen     REACTION: nausea  . Oxycodone-Acetaminophen     REACTION: nausea    Past Medical History  Diagnosis Date  . Anxiety 1990  . Depression 2004  . VD (venereal disease)     x 2  . Suicidal intent 2004    pills    Past Surgical History  Procedure Date  . Abdominal hysterectomy 2005    endometriosis    Family History  Problem Relation Age of Onset  . Hypertension Mother   . Stroke Mother     x 2  . Obesity Mother   . Cancer Father     bladder, throat, and prostate  . Cancer Maternal Grandmother     lung    History   Social History  . Marital Status: Married    Spouse Name: N/A    Number of Children: 2  . Years of Education: N/A   Occupational History  . Unemployed    Social History Main Topics  . Smoking status: Never Smoker   . Smokeless tobacco: Never Used  . Alcohol Use: No  . Drug Use: Not on file  . Sexually Active: Not on file   Other Topics Concern  . Not on file   Social History Narrative   Patient signed designated party release form and gives Maison Agrusa (spouse) [phone] access to medical records.   Review of Systems Lost a lot of weight by  cutting back on eating No GI symptoms    Objective:   Physical Exam  Constitutional: She appears well-developed and well-nourished. No distress.  HENT:  Head: Normocephalic and atraumatic.  Mouth/Throat: Oropharynx is clear and moist. No oropharyngeal exudate.       Moderate left nasal congestion Mild on right No sinus tenderness TMs normal  Neck: Normal range of motion. Neck supple.       Distinct node at angle of right jaw  Pulmonary/Chest: Effort normal and breath sounds normal. No respiratory distress. She has no wheezes. She has no rales.  Lymphadenopathy:    She has cervical adenopathy.  Skin:       Red, slightly warm and inflamed areas on right occiput. Slight tenderness          Assessment & Plan:

## 2011-12-16 ENCOUNTER — Other Ambulatory Visit: Payer: Self-pay | Admitting: Internal Medicine

## 2011-12-16 NOTE — Telephone Encounter (Signed)
Okay #60 x 0 

## 2011-12-17 NOTE — Telephone Encounter (Signed)
rx called into pharmacy

## 2011-12-23 ENCOUNTER — Encounter: Payer: Self-pay | Admitting: Family Medicine

## 2011-12-23 ENCOUNTER — Ambulatory Visit (INDEPENDENT_AMBULATORY_CARE_PROVIDER_SITE_OTHER): Payer: Self-pay | Admitting: Family Medicine

## 2011-12-23 VITALS — BP 112/78 | HR 76 | Temp 98.6°F | Wt 123.8 lb

## 2011-12-23 DIAGNOSIS — J329 Chronic sinusitis, unspecified: Secondary | ICD-10-CM | POA: Insufficient documentation

## 2011-12-23 MED ORDER — FLUTICASONE PROPIONATE 50 MCG/ACT NA SUSP: 2.0000 | Freq: Every day | NASAL | Status: DC

## 2011-12-23 MED ORDER — AMOXICILLIN 875 MG PO TABS: 875.0000 mg | ORAL_TABLET | Freq: Two times a day (BID) | ORAL | Status: AC

## 2011-12-23 NOTE — Assessment & Plan Note (Signed)
Nontoxic, I would take plain mucinex with plenty of water. Use nasal saline and gargle with warm salt water.  If not better then use the flonase and fill the amoxil. ddx d/wpt.  She understood.

## 2011-12-23 NOTE — Patient Instructions (Addendum)
I would take plain mucinex with plenty of water.  Use nasal saline and gargle with warm salt water.  If not better then use the flonase and fill the amoxil.  Take care.

## 2011-12-23 NOTE — Progress Notes (Signed)
duration of symptoms: 5 days.  Rhinorrhea: yes congestion:yes ear pain:yes sore throat:yes Cough:yes, dry, worse at night Myalgias: mild HA, at the temples Fatigued No fever known, but has been sweating.   Voice altered over the last few days.   Tried pseudophead, alka seltzer, mucinex for couch w/o relief.  Works at [employer]. Mult sick exposures.    ROS: See HPI.  Otherwise negative.    Meds, vitals, and allergies reviewed.   GEN: nad, alert and oriented HEENT: mucous membranes moist, TM w/o erythema, nasal epithelium injected, OP with cobblestoning, sinuses midly ttp x4 NECK: supple w/o LA CV: rrr. PULM: ctab, no inc wob ABD: soft, +bs EXT: no edema

## 2011-12-27 ENCOUNTER — Telehealth: Payer: Self-pay | Admitting: Internal Medicine

## 2011-12-27 NOTE — Telephone Encounter (Signed)
Caller: Alexis Pierce/Patient is calling with a question about Amoxicillin 875 Mgm (she thinks, Rx At [facility]). Caller states was advised to have Rx filled if no improvement by  today, 12/27/11. Dr Para March prescribed on 12/23/11.  Rx is $25.00 and would like a less expensive . No change or worsening of sxs, but no improvement, denies need for triage. Per standing orders pharmacy called, [facility] [employer], spoke with [employer], pharmacist who states Amoxicillin is generic and correct price is $14.17. Caller informed and states she will have filled.

## 2011-12-27 NOTE — Telephone Encounter (Signed)
Noted, thanks!

## 2012-01-14 ENCOUNTER — Other Ambulatory Visit: Payer: Self-pay | Admitting: Internal Medicine

## 2012-01-14 NOTE — Telephone Encounter (Signed)
Okay to fill? 

## 2012-01-14 NOTE — Telephone Encounter (Signed)
Medication phoned to pharmacy.  

## 2012-01-14 NOTE — Telephone Encounter (Signed)
Okay trazodone for a year Alprazolam #60 x 0 

## 2012-02-13 ENCOUNTER — Other Ambulatory Visit: Payer: Self-pay | Admitting: Internal Medicine

## 2012-02-14 NOTE — Telephone Encounter (Signed)
Okay #60 x 0 

## 2012-02-14 NOTE — Telephone Encounter (Signed)
rx called into pharmacy

## 2012-03-13 ENCOUNTER — Other Ambulatory Visit: Payer: Self-pay | Admitting: Internal Medicine

## 2012-03-13 NOTE — Telephone Encounter (Signed)
rx called into pharmacy

## 2012-03-13 NOTE — Telephone Encounter (Signed)
Okay #60 x 0 

## 2012-03-13 NOTE — Telephone Encounter (Signed)
Already called in

## 2012-04-10 ENCOUNTER — Other Ambulatory Visit: Payer: Self-pay | Admitting: Internal Medicine

## 2012-04-10 NOTE — Telephone Encounter (Signed)
rx called into pharmacy

## 2012-04-10 NOTE — Telephone Encounter (Signed)
Okay #60 x 0 

## 2012-05-08 ENCOUNTER — Other Ambulatory Visit: Payer: Self-pay | Admitting: Internal Medicine

## 2012-05-08 NOTE — Telephone Encounter (Signed)
Okay #60 x 0 

## 2012-05-08 NOTE — Telephone Encounter (Signed)
rx called into pharmacy

## 2012-06-03 ENCOUNTER — Other Ambulatory Visit: Payer: Self-pay | Admitting: Internal Medicine

## 2012-06-04 NOTE — Telephone Encounter (Signed)
rx called into pharmacy

## 2012-06-04 NOTE — Telephone Encounter (Signed)
Okay #60 x 0 

## 2012-07-03 ENCOUNTER — Other Ambulatory Visit: Payer: Self-pay | Admitting: Internal Medicine

## 2012-07-06 NOTE — Telephone Encounter (Signed)
rx called into pharmacy .left message to have patient return my call.  

## 2012-07-06 NOTE — Telephone Encounter (Signed)
Okay #60 x 0 She is still using this regularly so she should set up an appt in the next few months to review

## 2012-08-04 ENCOUNTER — Other Ambulatory Visit: Payer: Self-pay | Admitting: Internal Medicine

## 2012-08-04 NOTE — Telephone Encounter (Signed)
Okay #60 x 0 Will need appt soon in order for me to continue to prescribe this controlled substance

## 2012-08-04 NOTE — Telephone Encounter (Signed)
rx called into pharmacy .left message to have patient return my call.  

## 2012-09-01 ENCOUNTER — Other Ambulatory Visit: Payer: Self-pay | Admitting: Internal Medicine

## 2012-09-02 NOTE — Telephone Encounter (Signed)
rx called into pharmacy .left message to have patient return my call.  

## 2012-09-02 NOTE — Telephone Encounter (Signed)
Okay #60 x 0 Please have her set up appt to review within the next few months--I haven't seen her in awhile (and I can reduce the price---let them know up front not to collect the $125)

## 2012-09-14 ENCOUNTER — Ambulatory Visit: Payer: Self-pay | Admitting: Internal Medicine

## 2012-09-14 DIAGNOSIS — Z0289 Encounter for other administrative examinations: Secondary | ICD-10-CM

## 2012-09-15 ENCOUNTER — Ambulatory Visit (INDEPENDENT_AMBULATORY_CARE_PROVIDER_SITE_OTHER): Payer: Self-pay | Admitting: Internal Medicine

## 2012-09-15 ENCOUNTER — Encounter: Payer: Self-pay | Admitting: Internal Medicine

## 2012-09-15 ENCOUNTER — Encounter: Payer: Self-pay | Admitting: *Deleted

## 2012-09-15 ENCOUNTER — Other Ambulatory Visit: Payer: Self-pay | Admitting: Internal Medicine

## 2012-09-15 VITALS — BP 118/70 | HR 95 | Temp 98.3°F | Wt 146.0 lb

## 2012-09-15 DIAGNOSIS — F411 Generalized anxiety disorder: Secondary | ICD-10-CM

## 2012-09-15 NOTE — Patient Instructions (Addendum)
Make sure you are not having any caffeine in drinks after lunch time (and limit before). If you are still having sleep problems, you can try melatonin-- 3-5 mg close to bedtime

## 2012-09-15 NOTE — Telephone Encounter (Signed)
Pt left v/m cking status of prozac refill to [facility] [address].pt request call back [phone].

## 2012-09-15 NOTE — Assessment & Plan Note (Signed)
Has been stable New sleep problems for the past 2 weeks Discussed making sure she is not having caffeine in evenings---stop that green tea Consider melatonin Continue the fluoxetine/xanax

## 2012-09-15 NOTE — Progress Notes (Signed)
Subjective:    Patient ID: Alexis Pierce, female    DOB: Nov 01, 1970, 42 y.o.   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, clean and return them (like after fire) Still no insurance---didn't sign up with exchange  Ongoing sleep problems 2 trazodone don't help and then would need xanax No trazodone in some time In past 2 weeks, will awaken with sense of someone sitting on chest, can't breathe, nausea, panic---- usually ~4:30AM Not related to when she adds the xanax Doesn't remember any dreams  Takes fluoxetine in AM (always has) Feels drained as the day goes on--then over tired  Used zoloft---had bad time with this lexapro didn't seem to help  Hands and legs go numb at times Notices it when she awakens and occ during the day if she is working over her head  Current Outpatient Prescriptions on File Prior to Visit  Medication Sig Dispense Refill  . ALPRAZolam (XANAX) 1 MG tablet TAKE ONE-HALF TO ONE TABLET BY MOUTH TWICE DAILY  60 tablet  0  . FLUoxetine (PROZAC) 20 MG capsule Take 20 mg by mouth daily.         No current facility-administered medications on file prior to visit.    Allergies  Allergen Reactions  . Hydrocodone-Acetaminophen     REACTION: nausea  . Oxycodone-Acetaminophen     REACTION: nausea    Past Medical History  Diagnosis Date  . Anxiety 1990  . Depression 2004  . VD (venereal disease)     x 2  . Suicidal intent 2004    pills  . Insomnia     Past Surgical History  Procedure Laterality Date  . Abdominal hysterectomy  2005    endometriosis    Family History  Problem Relation Age of Onset  . Hypertension Mother   . Stroke Mother     x 2  . Obesity Mother   . Cancer Father     bladder, throat, and prostate  . Cancer Maternal Grandmother     lung    History   Social History  . Marital  Status: Married    Spouse Name: N/A    Number of Children: 2  . Years of Education: N/A   Occupational History  . Contents worker for [employer]    Social History Main Topics  . Smoking status: Never Smoker   . Smokeless tobacco: Never Used  . Alcohol Use: No  . Drug Use: No  . Sexually Active: Not on file   Other Topics Concern  . Not on file   Social History Narrative   Patient signed designated party release form and gives Elleana Stillson (spouse) [phone] access to medical records.   Review of Systems Drinks diet green tea-- not sure if it has caffeine Appetite is high Has gained 30# since last year Marriage better--except for her lack of libido    Objective:   Physical Exam  Constitutional: She appears well-developed and well-nourished. No distress.  Neck: Normal range of motion. Neck supple.  Cardiovascular: Normal rate, regular rhythm and normal heart sounds.  Exam reveals no gallop.   No murmur heard. Pulmonary/Chest: Effort normal and breath sounds normal. No respiratory distress. She has no wheezes. She has no rales.  Abdominal: Soft. There is no tenderness.  Musculoskeletal: She exhibits no edema and no tenderness.  Lymphadenopathy:    She has no  cervical adenopathy.  Psychiatric: She has a normal mood and affect. Her behavior is normal.          Assessment & Plan:

## 2012-09-16 NOTE — Telephone Encounter (Signed)
rx sent to pharmacy by e-script  

## 2012-09-16 NOTE — Telephone Encounter (Signed)
Okay to fill #90 x 3 

## 2012-10-01 ENCOUNTER — Other Ambulatory Visit: Payer: Self-pay | Admitting: Internal Medicine

## 2012-10-05 NOTE — Telephone Encounter (Signed)
Pt left v/m to ck on status of alprazolam. Pt request call back when called in.

## 2012-10-06 ENCOUNTER — Other Ambulatory Visit: Payer: Self-pay | Admitting: Internal Medicine

## 2012-10-06 NOTE — Telephone Encounter (Signed)
rx called into pharmacy

## 2012-10-06 NOTE — Telephone Encounter (Signed)
Already called in and spoke with patient

## 2012-10-06 NOTE — Telephone Encounter (Signed)
Okay #60 x 0 

## 2012-11-04 ENCOUNTER — Other Ambulatory Visit: Payer: Self-pay | Admitting: Internal Medicine

## 2012-11-04 NOTE — Telephone Encounter (Signed)
rx called into pharmacy

## 2012-11-04 NOTE — Telephone Encounter (Signed)
Okay #60 x 0 

## 2012-12-03 ENCOUNTER — Other Ambulatory Visit: Payer: Self-pay | Admitting: Internal Medicine

## 2012-12-03 NOTE — Telephone Encounter (Signed)
letvak patient

## 2012-12-04 NOTE — Telephone Encounter (Signed)
Medication phoned to pharmacy.  

## 2012-12-04 NOTE — Telephone Encounter (Signed)
Please call in

## 2012-12-31 ENCOUNTER — Other Ambulatory Visit: Payer: Self-pay | Admitting: Family Medicine

## 2013-01-01 NOTE — Telephone Encounter (Signed)
rx called into pharmacy

## 2013-01-01 NOTE — Telephone Encounter (Signed)
Electronic refill request.  Please advise. 

## 2013-01-01 NOTE — Telephone Encounter (Signed)
Okay #60 x 0 

## 2013-02-01 ENCOUNTER — Other Ambulatory Visit: Payer: Self-pay | Admitting: Internal Medicine

## 2013-02-01 NOTE — Telephone Encounter (Signed)
rx called into pharmacy

## 2013-02-01 NOTE — Telephone Encounter (Signed)
Okay #60 x 0 

## 2013-02-17 ENCOUNTER — Emergency Department: Payer: Self-pay | Admitting: Internal Medicine

## 2013-02-17 LAB — COMPREHENSIVE METABOLIC PANEL
Albumin: 3.7 g/dL (ref 3.4–5.0)
Alkaline Phosphatase: 58 U/L (ref 50–136)
Anion Gap: 4 — ABNORMAL LOW (ref 7–16)
BUN: 12 mg/dL (ref 7–18)
Bilirubin,Total: 0.2 mg/dL (ref 0.2–1.0)
Calcium, Total: 9.2 mg/dL (ref 8.5–10.1)
Chloride: 110 mmol/L — ABNORMAL HIGH (ref 98–107)
Creatinine: 0.68 mg/dL (ref 0.60–1.30)
EGFR (African American): >60
EGFR (Non-African Amer.): >60
Glucose: 93 mg/dL (ref 65–99)
SGOT(AST): 26 U/L (ref 15–37)
Sodium: 139 mmol/L (ref 136–145)

## 2013-02-17 LAB — CK TOTAL AND CKMB (NOT AT ARMC): CK-MB: 1.5 ng/mL (ref 0.5–3.6)

## 2013-02-17 LAB — URINALYSIS, COMPLETE
Bacteria: NONE SEEN
Ketone: NEGATIVE
Leukocyte Esterase: NEGATIVE
Nitrite: NEGATIVE
Ph: 8 (ref 4.5–8.0)
RBC,UR: 5 /HPF (ref 0–5)
Specific Gravity: 1.005 (ref 1.003–1.030)
Squamous Epithelial: NONE SEEN
WBC UR: <1 /HPF (ref 0–5)

## 2013-02-17 LAB — CBC
HCT: 35.5 % (ref 35.0–47.0)
HGB: 12.5 g/dL (ref 12.0–16.0)
MCHC: 35.1 g/dL (ref 32.0–36.0)
Platelet: 266 10*3/uL (ref 150–440)
RBC: 3.96 10*6/uL (ref 3.80–5.20)

## 2013-03-02 ENCOUNTER — Other Ambulatory Visit: Payer: Self-pay | Admitting: Internal Medicine

## 2013-03-02 NOTE — Telephone Encounter (Signed)
Okay #60 x 0  Please take the controlled med caution off chart from March (she did come in for visit then)

## 2013-03-02 NOTE — Telephone Encounter (Signed)
rx called into pharmacy

## 2013-03-16 ENCOUNTER — Telehealth: Payer: Self-pay

## 2013-03-16 NOTE — Telephone Encounter (Signed)
I will fill only 1 more month without an appt

## 2013-03-16 NOTE — Telephone Encounter (Signed)
Pt said can not come for 03/18/13, 6 mth f/u due to cannot afford co pay at this time; pt is going to cb to reschedule 6 mth f/u. Pt wants to make sure she will be able to get alprazolam refilled mid October if she reschedules.Please advise. Pt request cb.

## 2013-03-17 ENCOUNTER — Encounter: Payer: Self-pay | Admitting: Radiology

## 2013-03-18 ENCOUNTER — Ambulatory Visit: Payer: Self-pay | Admitting: Internal Medicine

## 2013-03-18 NOTE — Telephone Encounter (Signed)
Pt has appt today

## 2013-04-01 ENCOUNTER — Other Ambulatory Visit: Payer: Self-pay | Admitting: Internal Medicine

## 2013-04-01 NOTE — Telephone Encounter (Signed)
This is the last refill until she has appt Let her know that I can reduce the charge to about $65---she shouldn't pay out front--just on the way out  Okay #60 x 0 only this last time

## 2013-04-01 NOTE — Telephone Encounter (Signed)
Spoke with patient and advised results, she said she was at work and couldn't really talk right now, but I told her she can't have any more refills until she's seen rx called into pharmacy

## 2013-04-01 NOTE — Telephone Encounter (Signed)
Last filled 03/02/13 

## 2013-04-21 ENCOUNTER — Other Ambulatory Visit: Payer: Self-pay | Admitting: Internal Medicine

## 2013-04-21 NOTE — Telephone Encounter (Signed)
Last filled 04/01/13

## 2013-04-22 NOTE — Telephone Encounter (Signed)
.  left message to have patient return my call.  

## 2013-04-22 NOTE — Telephone Encounter (Signed)
Let her know she is a week early

## 2013-04-29 ENCOUNTER — Encounter: Payer: Self-pay | Admitting: Radiology

## 2013-04-30 ENCOUNTER — Encounter: Payer: Self-pay | Admitting: Internal Medicine

## 2013-04-30 ENCOUNTER — Ambulatory Visit (INDEPENDENT_AMBULATORY_CARE_PROVIDER_SITE_OTHER): Payer: Self-pay | Admitting: Internal Medicine

## 2013-04-30 VITALS — BP 100/70 | HR 90 | Temp 98.0°F | Wt 140.0 lb

## 2013-04-30 DIAGNOSIS — F329 Major depressive disorder, single episode, unspecified: Secondary | ICD-10-CM

## 2013-04-30 DIAGNOSIS — F411 Generalized anxiety disorder: Secondary | ICD-10-CM

## 2013-04-30 MED ORDER — ALPRAZOLAM 1 MG PO TABS: 1.0000 mg | ORAL_TABLET | Freq: Two times a day (BID) | ORAL | Status: DC | PRN

## 2013-04-30 NOTE — Progress Notes (Signed)
Pre-visit discussion using our clinic review tool. No additional management support is needed unless otherwise documented below in the visit note.  

## 2013-04-30 NOTE — Progress Notes (Signed)
  Subjective:    Patient ID: Alexis Pierce, female    DOB: 03/13/1971, 42 y.o.   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 ALPRAZolam (XANAX) 1 MG tablet TAKE ONE-HALF TO ONE TABLET BY MOUTH TWICE DAILY  60 tablet  0  . FLUoxetine (PROZAC) 20 MG capsule TAKE ONE CAPSULE BY MOUTH EVERY DAY  90 capsule  3   No current facility-administered medications on file prior to visit.    Allergies  Allergen Reactions  . Hydrocodone-Acetaminophen     REACTION: nausea  . Oxycodone-Acetaminophen     REACTION: nausea    Past Medical History  Diagnosis Date  . Anxiety 1990  . Depression 2004  . VD (venereal disease)     x 2  . Suicidal intent 2004    pills  . Insomnia     Past Surgical History  Procedure Laterality Date  . Abdominal hysterectomy  2005    endometriosis    Family History  Problem Relation Age of Onset  . Hypertension Mother   . Stroke Mother     x 2  . Obesity Mother   . Cancer Father     bladder, throat, and prostate  . Cancer Maternal Grandmother     lung    History   Social History  . Marital Status: Married    Spouse Name: N/A    Number of Children: 2  . Years of Education: N/A   Occupational History  . [employer]--making ice cream    Social History Main Topics  . Smoking status: Never Smoker   . Smokeless tobacco: Never Used  . Alcohol Use: No  . Drug Use: No  . Sexual Activity: Not on file   Other Topics Concern  . Not on file   Social History Narrative   Patient signed  designated party release form and gives Alexis Pierce (spouse) [phone] access to medical records.   Review of Systems Appetite is "too okay" Weight is stable    Objective:   Physical Exam  Constitutional: She appears well-developed and well-nourished. No distress.  Psychiatric: She has a normal mood and affect. Her behavior is normal.  Normal appearance and speech Normal engagement          Assessment & Plan:

## 2013-04-30 NOTE — Assessment & Plan Note (Signed)
Controlled with fluoxetine Now gets angry with husband--but sounds expected with husband's status No changes

## 2013-04-30 NOTE — Assessment & Plan Note (Signed)
Has been limiting the alprazolam Reviewed the [place] controlled substances site---no other Rx

## 2013-05-11 ENCOUNTER — Ambulatory Visit: Payer: Self-pay | Admitting: Internal Medicine

## 2013-05-30 ENCOUNTER — Other Ambulatory Visit: Payer: Self-pay | Admitting: Internal Medicine

## 2013-05-31 NOTE — Telephone Encounter (Signed)
rx called into pharmacy

## 2013-05-31 NOTE — Telephone Encounter (Signed)
Okay #60 x 0 

## 2013-05-31 NOTE — Telephone Encounter (Signed)
Last filled 04/30/13

## 2013-06-28 ENCOUNTER — Other Ambulatory Visit: Payer: Self-pay | Admitting: Internal Medicine

## 2013-06-29 ENCOUNTER — Other Ambulatory Visit: Payer: Self-pay | Admitting: Internal Medicine

## 2013-06-29 NOTE — Telephone Encounter (Signed)
05/30/13 last filled

## 2013-06-29 NOTE — Telephone Encounter (Signed)
rx called into pharmacy

## 2013-06-29 NOTE — Telephone Encounter (Signed)
Okay #60 x 0 

## 2013-07-27 ENCOUNTER — Other Ambulatory Visit: Payer: Self-pay | Admitting: Internal Medicine

## 2013-07-27 NOTE — Telephone Encounter (Signed)
Okay #60 x 0 

## 2013-07-27 NOTE — Telephone Encounter (Signed)
rx called into pharmacy

## 2013-08-24 ENCOUNTER — Other Ambulatory Visit: Payer: Self-pay | Admitting: Internal Medicine

## 2013-08-25 NOTE — Telephone Encounter (Signed)
Okay #60 x 0 

## 2013-08-25 NOTE — Telephone Encounter (Signed)
Rx called to pharmacy per Dr. Alphonsus SiasLetvak.

## 2013-09-16 ENCOUNTER — Emergency Department: Payer: Self-pay | Admitting: Emergency Medicine

## 2013-09-17 ENCOUNTER — Emergency Department: Payer: Self-pay | Admitting: Emergency Medicine

## 2013-09-17 LAB — COMPREHENSIVE METABOLIC PANEL
ALBUMIN: 4.1 g/dL (ref 3.4–5.0)
AST: 40 U/L — AB (ref 15–37)
Alkaline Phosphatase: 70 U/L
Anion Gap: 6 — ABNORMAL LOW (ref 7–16)
BUN: 11 mg/dL (ref 7–18)
Bilirubin,Total: 0.4 mg/dL (ref 0.2–1.0)
CALCIUM: 9.1 mg/dL (ref 8.5–10.1)
CREATININE: 0.65 mg/dL (ref 0.60–1.30)
Chloride: 107 mmol/L (ref 98–107)
Co2: 26 mmol/L (ref 21–32)
EGFR (African American): >60
Glucose: 102 mg/dL — ABNORMAL HIGH (ref 65–99)
Osmolality: 277 (ref 275–301)
POTASSIUM: 4.1 mmol/L (ref 3.5–5.1)
SGPT (ALT): 26 U/L (ref 12–78)
Sodium: 139 mmol/L (ref 136–145)
Total Protein: 7.6 g/dL (ref 6.4–8.2)

## 2013-09-17 LAB — URINALYSIS, COMPLETE
BILIRUBIN, UR: NEGATIVE
Bacteria: NONE SEEN
GLUCOSE, UR: NEGATIVE mg/dL (ref 0–75)
Leukocyte Esterase: NEGATIVE
Nitrite: NEGATIVE
Ph: 6 (ref 4.5–8.0)
Protein: NEGATIVE
RBC,UR: 41 /HPF (ref 0–5)
Specific Gravity: 1.012 (ref 1.003–1.030)
Squamous Epithelial: <1
WBC UR: 1 /HPF (ref 0–5)

## 2013-09-17 LAB — CBC WITH DIFFERENTIAL/PLATELET
Basophil #: 0 10*3/uL (ref 0.0–0.1)
Basophil %: 0.5 %
EOS PCT: 0.7 %
Eosinophil #: 0 10*3/uL (ref 0.0–0.7)
HCT: 36 % (ref 35.0–47.0)
HGB: 12.5 g/dL (ref 12.0–16.0)
LYMPHS ABS: 0.8 10*3/uL — AB (ref 1.0–3.6)
LYMPHS PCT: 10.9 %
MCH: 31.6 pg (ref 26.0–34.0)
MCHC: 34.8 g/dL (ref 32.0–36.0)
MCV: 91 fL (ref 80–100)
Monocyte #: 0.3 x10 3/mm (ref 0.2–0.9)
Monocyte %: 4.8 %
Neutrophil #: 5.9 10*3/uL (ref 1.4–6.5)
Neutrophil %: 83.1 %
Platelet: 245 10*3/uL (ref 150–440)
RBC: 3.96 10*6/uL (ref 3.80–5.20)
RDW: 12.9 % (ref 11.5–14.5)
WBC: 7.1 10*3/uL (ref 3.6–11.0)

## 2013-09-24 ENCOUNTER — Other Ambulatory Visit: Payer: Self-pay | Admitting: Internal Medicine

## 2013-09-24 NOTE — Telephone Encounter (Signed)
Okay #60 x 0 

## 2013-09-24 NOTE — Telephone Encounter (Signed)
08/25/13 

## 2013-09-24 NOTE — Telephone Encounter (Signed)
rx called into pharmacy

## 2013-10-22 ENCOUNTER — Other Ambulatory Visit: Payer: Self-pay | Admitting: Internal Medicine

## 2013-10-22 NOTE — Telephone Encounter (Signed)
10/24/13 

## 2013-10-22 NOTE — Telephone Encounter (Signed)
rx called into pharmacy

## 2013-10-22 NOTE — Telephone Encounter (Signed)
Okay #60 x 0 

## 2013-10-28 ENCOUNTER — Ambulatory Visit: Payer: Self-pay | Admitting: Internal Medicine

## 2013-10-29 ENCOUNTER — Ambulatory Visit: Payer: Self-pay | Admitting: Internal Medicine

## 2013-11-19 ENCOUNTER — Ambulatory Visit: Payer: Self-pay | Admitting: Internal Medicine

## 2013-11-19 ENCOUNTER — Other Ambulatory Visit: Payer: Self-pay | Admitting: Internal Medicine

## 2013-11-19 DIAGNOSIS — Z0289 Encounter for other administrative examinations: Secondary | ICD-10-CM

## 2013-11-19 NOTE — Telephone Encounter (Signed)
Pt called at 8:05am this morning stating that she had to go out of town on a family emergency, she had an appt at 8:15am today and she also called in her rx today.

## 2013-11-19 NOTE — Telephone Encounter (Signed)
Refill denied 

## 2013-11-19 NOTE — Telephone Encounter (Signed)
Okay #60 x 0 

## 2013-11-19 NOTE — Telephone Encounter (Signed)
10/22/13 

## 2013-11-19 NOTE — Telephone Encounter (Signed)
Please deny the refill Her actions are concerning and questionable I will not refill till she comes in the office I may insist on a drug testing even if she has no insurance

## 2013-11-20 ENCOUNTER — Other Ambulatory Visit: Payer: Self-pay | Admitting: Internal Medicine

## 2013-11-22 NOTE — Telephone Encounter (Signed)
Pt can not get a refill until after her appt

## 2013-11-24 ENCOUNTER — Encounter: Payer: Self-pay | Admitting: Internal Medicine

## 2013-11-24 ENCOUNTER — Ambulatory Visit (INDEPENDENT_AMBULATORY_CARE_PROVIDER_SITE_OTHER): Payer: Self-pay | Admitting: Internal Medicine

## 2013-11-24 ENCOUNTER — Encounter: Payer: Self-pay | Admitting: *Deleted

## 2013-11-24 VITALS — BP 110/60 | HR 60 | Temp 97.7°F | Wt 137.0 lb

## 2013-11-24 DIAGNOSIS — R6884 Jaw pain: Secondary | ICD-10-CM | POA: Insufficient documentation

## 2013-11-24 DIAGNOSIS — F411 Generalized anxiety disorder: Secondary | ICD-10-CM

## 2013-11-24 MED ORDER — ALPRAZOLAM 1 MG PO TABS: 1.0000 mg | ORAL_TABLET | Freq: Two times a day (BID) | ORAL | Status: DC | PRN

## 2013-11-24 NOTE — Assessment & Plan Note (Signed)
I suspect from teeth Despite no insurance, I recommend that she see a dentist

## 2013-11-24 NOTE — Progress Notes (Signed)
Pre visit review using our clinic review tool, if applicable. No additional management support is needed unless otherwise documented below in the visit note. 

## 2013-11-24 NOTE — Assessment & Plan Note (Signed)
Ongoing issues Okay with bid alprazolam

## 2013-11-24 NOTE — Progress Notes (Signed)
   Subjective:    Patient ID: Alexis Pierce, female    DOB: [birth year], 43 y.o.   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 Did go back to work  No depression--mood is better lately Anxiety persists but not bad Still uses the alprazolam in Am and bedtime  No chest pain No SOB No stomach problems  Had broken tooth and infection 2 months ago Got tramadol #15 and antibiotics  Current Outpatient Prescriptions on File Prior to Visit  Medication Sig Dispense Refill  . ALPRAZolam (XANAX) 1 MG tablet TAKE ONE TABLET BY MOUTH TWICE DAILY AS NEEDED FOR ANXIETY OR SLEEP  60 tablet  0  . FLUoxetine (PROZAC) 20 MG capsule TAKE ONE CAPSULE BY MOUTH EVERY DAY  90 capsule  3   No current facility-administered medications on file prior to visit.    Allergies  Allergen Reactions  . Hydrocodone-Acetaminophen     REACTION: nausea  . Oxycodone-Acetaminophen     REACTION: nausea    Past Medical History  Diagnosis Date  . Anxiety 1990  . Depression 2004  . VD (venereal disease)     x 2  . Suicidal intent 2004    pills  . Insomnia     Past Surgical History  Procedure Laterality Date  . Abdominal hysterectomy  2005    endometriosis    Family History  Problem Relation Age of Onset  . Hypertension Mother   . Stroke Mother     x 2  . Obesity Mother   . Cancer Father     bladder, throat, and prostate  . Cancer Maternal Grandmother     lung    History   Social History  . Marital Status: Married    Spouse Name: N/A    Number of Children: 2  . Years of Education: N/A   Occupational History  . [employer]--making ice cream    Social History Main Topics  . Smoking status: Never Smoker   . Smokeless tobacco: Never Used  . Alcohol Use: No  . Drug Use: No  . Sexual Activity: Not on file   Other Topics Concern  . Not on file   Social History Narrative     Patient signed designated party release form and gives Anndrea Stokley (spouse) [phone] access to medical records.   Review of Systems Sleeps okay Appetite is good---"too much" Weight is down 3# Has had some intermittent tenderness behind left ear for a month. No discharge. Has been swimming--but not much    Objective:   Physical Exam  Constitutional: She appears well-developed and well-nourished. No distress.  HENT:  Tenderness is at angle of left mandible Some broken teeth --including left upper molar TMs normal and no tragal tenderness  Psychiatric: She has a normal mood and affect. Her behavior is normal.          Assessment & Plan:

## 2013-12-20 ENCOUNTER — Encounter: Payer: Self-pay | Admitting: Internal Medicine

## 2013-12-24 ENCOUNTER — Other Ambulatory Visit: Payer: Self-pay | Admitting: Internal Medicine

## 2013-12-24 NOTE — Telephone Encounter (Signed)
Okay #60 x 0 

## 2013-12-24 NOTE — Telephone Encounter (Signed)
11/24/13 

## 2013-12-24 NOTE — Telephone Encounter (Signed)
rx called into pharmacy

## 2014-01-03 ENCOUNTER — Ambulatory Visit (INDEPENDENT_AMBULATORY_CARE_PROVIDER_SITE_OTHER): Payer: Self-pay | Admitting: Internal Medicine

## 2014-01-03 ENCOUNTER — Encounter: Payer: Self-pay | Admitting: Internal Medicine

## 2014-01-03 VITALS — BP 100/78 | HR 85 | Temp 98.3°F | Wt 135.0 lb

## 2014-01-03 DIAGNOSIS — R55 Syncope and collapse: Secondary | ICD-10-CM

## 2014-01-03 NOTE — Progress Notes (Signed)
Pre visit review using our clinic review tool, if applicable. No additional management support is needed unless otherwise documented below in the visit note. 

## 2014-01-03 NOTE — Assessment & Plan Note (Addendum)
Not clearly orthostatic or vagal by history I checked orthostatics--- supine 114/80  Pulse 60 Standing 120/80 with pulse 72 Both times BP was faint and seemed to go in and out (but not like a paradoxus)  EKG is normal I am most concerned about arrhythmia or possibly seizure disorder (since she did bite her tongue once though likely from the fall) Certainly cardiac is more likely, especially with the premonitory symptoms Work up may be limited by lack of insurance---will start by referring to cardiologist  Discussed increasing fluids and salt for now---just in case

## 2014-01-03 NOTE — Progress Notes (Signed)
Subjective:    Patient ID: Alexis Pierce, female    DOB: 1970-08-14, 43 y.o.   MRN: [id number]  HPI Started about 6 weeks ago  This past episode was 2 days, other was 8 days before that, another 2 weeks before that and initial one about 6 weeks  Fairly similar First was on computer---got light headed, sweaty and nauseated Went to lie down and woke up on the floor Breathing fast and feels cold at first upon awakening  The next 3 times she has been standing Same lightheaded feeling, nausea or diaphoresis Thinks she has passed out very soon after the initial bad feeling  10 days ago--she fell and hit head and bit lip No other injuries with the faints otherwise Other 3 spells didn't have injuries  2 days ago, she felt it soon enough to sit down on floor--then passed out from there  No other dizzy spells Not related to eating, stress, moving bowels, etc  No witnesses Not sure how long she is unconscious with these spells  Current Outpatient Prescriptions on File Prior to Visit  Medication Sig Dispense Refill  . ALPRAZolam (XANAX) 1 MG tablet TAKE ONE TABLET BY MOUTH TWICE DAILY AS NEEDED FOR ANXIETY/SLEEP  60 tablet  0  . FLUoxetine (PROZAC) 20 MG capsule TAKE ONE CAPSULE BY MOUTH EVERY DAY  90 capsule  3   No current facility-administered medications on file prior to visit.    Allergies  Allergen Reactions  . Penicillins     Shaking, vomiting  . Hydrocodone-Acetaminophen     REACTION: nausea  . Oxycodone-Acetaminophen     REACTION: nausea    Past Medical History  Diagnosis Date  . Anxiety 1990  . Depression 2004  . VD (venereal disease)     x 2  . Suicidal intent 2004    pills  . Insomnia     Past Surgical History  Procedure Laterality Date  . Abdominal hysterectomy  2005    endometriosis    Family History  Problem Relation Age of Onset  . Hypertension Mother   . Stroke Mother     x 2  . Obesity Mother   . Cancer Father     bladder, throat, and  prostate  . Cancer Maternal Grandmother     lung    History   Social History  . Marital Status: Married    Spouse Name: N/A    Number of Children: 2  . Years of Education: N/A   Occupational History  . [employer]--making ice cream    Social History Main Topics  . Smoking status: Never Smoker   . Smokeless tobacco: Never Used  . Alcohol Use: No  . Drug Use: No  . Sexual Activity: Not on file   Other Topics Concern  . Not on file   Social History Narrative   Patient signed designated party release form and gives Alexis Pierce (spouse) [phone] access to medical records.   Review of Systems No headaches No chest pain--but feels fluttering in chest at first upon awakening Has been eating and drinking okay    Objective:   Physical Exam  Constitutional: She appears well-developed and well-nourished. No distress.  Neck: Normal range of motion. Neck supple. No thyromegaly present.  Cardiovascular: Normal rate, regular rhythm, normal heart sounds and intact distal pulses.  Exam reveals no gallop.   No murmur heard. Pulmonary/Chest: Effort normal and breath sounds normal. No respiratory distress. She has no wheezes. She has no rales.  Lymphadenopathy:    She has no cervical adenopathy.  Neurological: She exhibits normal muscle tone. Coordination normal.  No focal weakness  Psychiatric: She has a normal mood and affect. Her behavior is normal.          Assessment & Plan:

## 2014-01-12 ENCOUNTER — Encounter (INDEPENDENT_AMBULATORY_CARE_PROVIDER_SITE_OTHER): Payer: Self-pay

## 2014-01-12 ENCOUNTER — Ambulatory Visit (INDEPENDENT_AMBULATORY_CARE_PROVIDER_SITE_OTHER): Payer: Self-pay | Admitting: Cardiovascular Disease

## 2014-01-12 ENCOUNTER — Encounter: Payer: Self-pay | Admitting: Cardiovascular Disease

## 2014-01-12 VITALS — BP 110/80 | HR 70 | Ht 63.0 in | Wt 134.5 lb

## 2014-01-12 DIAGNOSIS — R55 Syncope and collapse: Secondary | ICD-10-CM

## 2014-01-12 DIAGNOSIS — F411 Generalized anxiety disorder: Secondary | ICD-10-CM

## 2014-01-12 NOTE — Progress Notes (Signed)
Patient ID: Alexis Pierce, female    DOB: [birth year], 43 y.o.   MRN: [id number]  HPI Comments: Alexis Pierce is a pleasant 43 year old woman with history of anxiety/depression, who has been on Xanax twice a day and Prozac for many years, presenting for syncope. She is a patient of Dr. Alphonsus SiasLetvak.  She reports that she had been doing well until May. At the beginning of May she had an episode of syncope while sitting at her computer. This occurred in the morning after breakfast. She felt dizzy, had sweating. The next thing she knew, she was on the floor. She did not hurt herself.  Second episode was syncope 12/16/2013 while standing in her kitchen. She had warning with dizziness, sweating as before, then found herself on the floor.  Third episode of syncope 1 week later 12/24/2013 in the morning while standing in her kitchen. She hit her head, bit her lip.  Fourth episode of syncope approximately one week later July 17 or July 18. Again in the morning while in the kitchen. Found herself  on the floor She reports that she only hurt herself on the third episode, other 3 episodes with no problems.  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 No episodes in the afternoon, no episodes of near syncope or lightheadedness in the afternoon or in the evening.  She takes both of her medications on an empty stomach first thing in the morning and has been doing so for quite some time. No recent illnesses, no recent weight loss. Weight is approximately around her average weight. She does report drinking significant amount of fluids on regular basis, through the overnight period as well  Orthostatics done in the office today shows blood pressure supine 115/78 with heart rate 79, sitting 122/78 heart rate 84, standing 115/81 with heart rate 89. No significant decline in her pressure with standing  EKG shows normal sinus rhythm with rate 70 beats per minute, no  significant ST or T wave changes   Outpatient Encounter Prescriptions as of 01/12/2014  Medication Sig  . ALPRAZolam (XANAX) 1 MG tablet TAKE ONE TABLET BY MOUTH TWICE DAILY AS NEEDED FOR ANXIETY/SLEEP  . FLUoxetine (PROZAC) 20 MG capsule TAKE ONE CAPSULE BY MOUTH EVERY DAY     Review of Systems  Constitutional: Negative.   HENT: Negative.   Eyes: Negative.   Respiratory: Negative.   Cardiovascular: Negative.   Gastrointestinal: Negative.   Endocrine: Negative.   Musculoskeletal: Negative.   Skin: Negative.   Allergic/Immunologic: Negative.   Neurological: Positive for dizziness and syncope.  Hematological: Negative.   Psychiatric/Behavioral: Negative.   All other systems reviewed and are negative.   BP 110/80  Pulse 70  Ht 5\' 3"  (1.6 m)  Wt 134 lb 8 oz (61.009 kg)  BMI 23.83 kg/m2  Physical Exam  Nursing note and vitals reviewed. Constitutional: She is oriented to person, place, and time. She appears well-developed and well-nourished.  HENT:  Head: Normocephalic.  Nose: Nose normal.  Mouth/Throat: Oropharynx is clear and moist.  Eyes: Conjunctivae are normal. Pupils are equal, round, and reactive to light.  Neck: Normal range of motion. Neck supple. No JVD present.  Cardiovascular: Normal rate, regular rhythm, S1 normal, S2 normal, normal heart sounds and intact distal pulses.  Exam reveals no gallop and no friction rub.   No murmur heard. Pulmonary/Chest: Effort normal and breath sounds normal. No respiratory distress. She has no wheezes. She has no  rales. She exhibits no tenderness.  Abdominal: Soft. Bowel sounds are normal. She exhibits no distension. There is no tenderness.  Musculoskeletal: Normal range of motion. She exhibits no edema and no tenderness.  Lymphadenopathy:    She has no cervical adenopathy.  Neurological: She is alert and oriented to person, place, and time. Coordination normal.  Skin: Skin is warm and dry. No rash noted. No erythema.   Psychiatric: She has a normal mood and affect. Her behavior is normal. Judgment and thought content normal.    Assessment and Plan

## 2014-01-12 NOTE — Addendum Note (Signed)
Addended by: Rhea BeltonMOODY, Dajana Gehrig R on: 01/12/2014 10:40 AM   Modules accepted: Orders

## 2014-01-12 NOTE — Assessment & Plan Note (Signed)
We have suggested she take her morning medications after breakfast

## 2014-01-12 NOTE — Assessment & Plan Note (Addendum)
A long discussion about her recent symptoms over the past several months. Etiology of her syncope is unclear at this time, concerning for orthostatic hypotension though blood pressure stable on today's visit. Previous office visits with primary care documented systolic pressure 100 in the past . Interestingly she had been doing well for quite some time, symptoms starting in May of this year with no precipitating factors. Unable to exclude arrhythmia. 30 day monitor has been ordered. Today Holter monitor would likely miss her syncopal episodes.  We have suggested she take her morning medications after breakfast, take a glass of water in the morning, wear compression hose in the morning. Also suggested she buy a blood pressure cuff and monitor her blood pressure while standing in the morning. Encouraged her to increase her salt intake.  If she shows drops in her pressures on periodic monitoring of her blood pressure or continues to have symptoms with no arrhythmia, one option would be to start midodrine 5 mg up to 10 mg in the morning only as this is when her episodes of syncope seem to occur.

## 2014-01-12 NOTE — Patient Instructions (Addendum)
You are doing well. No medication changes were made.  Please wear compression hose, knee high Glass of water first think in the AM Take the Am medications after breakfast  Increase the salt in your diet  Look for a blood pressure cuff to buy  We will order a 30 day monitor for syncope  Please call us if you have new issues that need to be addressed before your next appt.  Your physician wants you to follow-up in: 6 weeks

## 2014-01-16 DIAGNOSIS — R55 Syncope and collapse: Secondary | ICD-10-CM

## 2014-01-20 ENCOUNTER — Telehealth: Payer: Self-pay

## 2014-01-20 NOTE — Telephone Encounter (Signed)
Pt asks if she should be wearing her monitor all the time, states Dr. Mariah MillingGollan told her to only wear it in the mornings before she goes to work. Please call and advise.

## 2014-01-20 NOTE — Telephone Encounter (Signed)
Left message for pt to wear her event monitor throughout the day, as there is no mention in her note otherwise.  Asked her to call back w/ any questions or concerns.

## 2014-01-24 ENCOUNTER — Other Ambulatory Visit: Payer: Self-pay | Admitting: Internal Medicine

## 2014-01-24 NOTE — Telephone Encounter (Signed)
12/24/13 

## 2014-01-24 NOTE — Telephone Encounter (Signed)
rx called into pharmacy

## 2014-01-24 NOTE — Telephone Encounter (Signed)
Okay #60 x 0 

## 2014-01-31 ENCOUNTER — Telehealth: Payer: Self-pay

## 2014-01-31 NOTE — Telephone Encounter (Signed)
Pt is currently wearing a heart monitor, states the "electrode"s has been irritating, has broken out, her skin is raw, states she called the company and they sent her sensitive "electrodes" Pt called the company and they suggested she remove the monitor for a day(yesterday). Pt asks if it is okay that she keep it off for a couple of days, due to her skin is "raw". States she has worn it for 2 weeks. Please call and advise.

## 2014-01-31 NOTE — Telephone Encounter (Signed)
Spoke w/ pt.  She reiterates that she cannot wear the monitor due to skin irritation.  She states that she has not worn it today and that she does not want to put it back on, as her skin is so raw.  Advised pt to come in to see Dr. Mariah MillingGollan on Wed to eval her skin and go over reported serious events.

## 2014-02-02 ENCOUNTER — Encounter: Payer: Self-pay | Admitting: Cardiovascular Disease

## 2014-02-02 ENCOUNTER — Ambulatory Visit (INDEPENDENT_AMBULATORY_CARE_PROVIDER_SITE_OTHER): Payer: Self-pay | Admitting: Cardiovascular Disease

## 2014-02-02 VITALS — BP 110/78 | HR 76 | Ht 63.0 in | Wt 135.5 lb

## 2014-02-02 DIAGNOSIS — F3289 Other specified depressive episodes: Secondary | ICD-10-CM

## 2014-02-02 DIAGNOSIS — R55 Syncope and collapse: Secondary | ICD-10-CM

## 2014-02-02 DIAGNOSIS — R0602 Shortness of breath: Secondary | ICD-10-CM

## 2014-02-02 DIAGNOSIS — F411 Generalized anxiety disorder: Secondary | ICD-10-CM

## 2014-02-02 DIAGNOSIS — F329 Major depressive disorder, single episode, unspecified: Secondary | ICD-10-CM

## 2014-02-02 NOTE — Patient Instructions (Signed)
You are doing well. No medication changes were made.  Continue high salt diet, high fluids, TEDS/compression hose for low blood pressures Please call for more dizzy spells  Please call us if you have new issues that need to be addressed before your next appt.  Your physician wants you to follow-up in: 6 months.  You will receive a reminder letter in the mail two months in advance. If you don't receive a letter, please call our office to schedule the follow-up appointment.

## 2014-02-02 NOTE — Progress Notes (Signed)
Patient ID: Alexis Pierce, female    DOB: February 04, 1971, 43 y.o.   MRN: [id number]  HPI Comments: Ms. Alexis Pierce is a pleasant 43 year old woman with history of anxiety/depression, who has been on Xanax twice a day and Prozac for many years, with a history of syncope. She is a patient of Dr. Alphonsus SiasLetvak. She presents for followup.  Previous history details syncope only happening in the morning   At the beginning of May she had an episode of syncope while sitting at her computer.She felt dizzy, had sweating. The next thing she knew, she was on the floor. She did not hurt herself.  Second episode was syncope 12/16/2013 while standing in her kitchen. She had warning with dizziness, sweating as before, then found herself on the floor.  Third episode of syncope 1 week later 12/24/2013 in the morning while standing in her kitchen. She hit her head, bit her lip.  Fourth episode of syncope approximately one week later July 17 or July 18. Again in the morning while in the kitchen. Found herself  on the floor She reports that she only hurt herself on the third episode, other 3 episodes with no problems.  In followup today, she wore a 30 day monitor for these 2 weeks. This did not show any significant arrhythmia apart from sinus tachycardia typically associated with exertion. No pauses or other arrhythmia noted. She has been recording her blood pressure and one out of 3 blood pressures, she has a systolic of 105 or less in a sitting position. She's not recorded any orthostatic blood pressures.. she denies any recent lightheadedness or dizziness, no near syncope. Otherwise she feels well. She has been drinking more fluids, wearing compression hose, has increased her salt intake No recent weight loss.  EKG shows normal sinus rhythm with rate 76 beats per minute, no significant ST or T wave changes   Outpatient Encounter Prescriptions as of 02/02/2014  Medication Sig  . ALPRAZolam (XANAX) 1 MG tablet TAKE ONE  TABLET BY MOUTH TWICE DAILY AS NEEDED FOR ANXIETY/SLEEP  . FLUoxetine (PROZAC) 20 MG capsule TAKE ONE CAPSULE BY MOUTH EVERY DAY    Review of Systems  Constitutional: Negative.   HENT: Negative.   Eyes: Negative.   Respiratory: Negative.   Cardiovascular: Negative.   Gastrointestinal: Negative.   Endocrine: Negative.   Musculoskeletal: Negative.   Skin: Negative.   Allergic/Immunologic: Negative.   Neurological: Negative.   Hematological: Negative.   Psychiatric/Behavioral: Negative.   All other systems reviewed and are negative.   BP 110/78  Pulse 76  Ht 5\' 3"  (1.6 m)  Wt 135 lb 8 oz (61.462 kg)  BMI 24.01 kg/m2  Physical Exam  Nursing note and vitals reviewed. Constitutional: She is oriented to person, place, and time. She appears well-developed and well-nourished.  HENT:  Head: Normocephalic.  Nose: Nose normal.  Mouth/Throat: Oropharynx is clear and moist.  Eyes: Conjunctivae are normal. Pupils are equal, round, and reactive to light.  Neck: Normal range of motion. Neck supple. No JVD present.  Cardiovascular: Normal rate, regular rhythm, S1 normal, S2 normal, normal heart sounds and intact distal pulses.  Exam reveals no gallop and no friction rub.   No murmur heard. Pulmonary/Chest: Effort normal and breath sounds normal. No respiratory distress. She has no wheezes. She has no rales. She exhibits no tenderness.  Abdominal: Soft. Bowel sounds are normal. She exhibits no distension. There is no tenderness.  Musculoskeletal: Normal range of motion. She exhibits no edema and no tenderness.  Lymphadenopathy:    She has no cervical adenopathy.  Neurological: She is alert and oriented to person, place, and time. Coordination normal.  Skin: Skin is warm and dry. No rash noted. No erythema.  Psychiatric: She has a normal mood and affect. Her behavior is normal. Judgment and thought content normal.    Assessment and Plan

## 2014-02-02 NOTE — Assessment & Plan Note (Addendum)
Managed by Dr. Alphonsus SiasLetvak.

## 2014-02-02 NOTE — Assessment & Plan Note (Signed)
Managed by Dr. Letvak.  

## 2014-02-02 NOTE — Assessment & Plan Note (Signed)
Workup for her syncope is so far unrevealing. She wore a monitor for 2 weeks that did not show any significant arrhythmia that could contribute to syncope. Of interest is her low systolic pressures at times. Frequent he has systolic pressures of less than 105. Suggested she also do orthostatic blood pressures, recheck her measurements with standing. Suggested she continue wearing compression hose as tolerated, maximizing her fluid intake, salt intake.  If she has recurrent syncope, could consider referral to EP for reveal device/implantable monitor

## 2014-02-14 ENCOUNTER — Telehealth: Payer: Self-pay

## 2014-02-14 NOTE — Telephone Encounter (Signed)
Attempted to contact pt regarding results of 30 day event monitor:  "NSR w/ rare episodes of sinus tachycardia"  Left message for pt to call back.

## 2014-02-15 NOTE — Telephone Encounter (Signed)
Reviewed results w/ pt.   She verbalizes understanding and will call back w/ any questions or concerns.  

## 2014-02-22 ENCOUNTER — Other Ambulatory Visit: Payer: Self-pay | Admitting: Internal Medicine

## 2014-02-22 NOTE — Telephone Encounter (Signed)
rx called into pharmacy

## 2014-02-22 NOTE — Telephone Encounter (Signed)
01/24/14 

## 2014-02-22 NOTE — Telephone Encounter (Signed)
Okay #60 x 0 

## 2014-02-23 ENCOUNTER — Other Ambulatory Visit: Payer: Self-pay

## 2014-02-23 ENCOUNTER — Ambulatory Visit (INDEPENDENT_AMBULATORY_CARE_PROVIDER_SITE_OTHER): Payer: Self-pay

## 2014-02-23 DIAGNOSIS — F411 Generalized anxiety disorder: Secondary | ICD-10-CM

## 2014-02-23 DIAGNOSIS — R55 Syncope and collapse: Secondary | ICD-10-CM

## 2014-02-24 ENCOUNTER — Ambulatory Visit: Payer: Self-pay | Admitting: Cardiovascular Disease

## 2014-03-24 ENCOUNTER — Other Ambulatory Visit: Payer: Self-pay | Admitting: Internal Medicine

## 2014-03-25 NOTE — Telephone Encounter (Signed)
Okay #60 x 0 Change to low risk on UDS

## 2014-03-25 NOTE — Telephone Encounter (Signed)
Electronic Rx request for xanax received. Patient's last office visit was 01/03/14 and medication last filled 02/22/14. Please advise.

## 2014-03-25 NOTE — Telephone Encounter (Signed)
Medication called in to [facility] pharmacy.

## 2014-04-25 ENCOUNTER — Other Ambulatory Visit: Payer: Self-pay | Admitting: Internal Medicine

## 2014-04-25 NOTE — Telephone Encounter (Signed)
Okay #60 x 0 

## 2014-04-25 NOTE — Telephone Encounter (Signed)
03/25/14 

## 2014-04-25 NOTE — Telephone Encounter (Signed)
rx called into pharmacy

## 2014-05-26 ENCOUNTER — Encounter: Payer: Self-pay | Admitting: Internal Medicine

## 2014-05-26 ENCOUNTER — Ambulatory Visit (INDEPENDENT_AMBULATORY_CARE_PROVIDER_SITE_OTHER): Payer: Self-pay | Admitting: Internal Medicine

## 2014-05-26 ENCOUNTER — Other Ambulatory Visit: Payer: Self-pay | Admitting: Internal Medicine

## 2014-05-26 VITALS — BP 122/76 | HR 80 | Temp 98.4°F | Wt 133.2 lb

## 2014-05-26 DIAGNOSIS — F419 Anxiety disorder, unspecified: Secondary | ICD-10-CM

## 2014-05-26 NOTE — Progress Notes (Signed)
   Subjective:    Patient ID: Alexis Pierce, female    DOB: Mar 13, 1971, 43 y.o.   MRN: [id number]  HPI Here for follow up of anxiety and syncope  No further dizziness or syncope Reviewed Dr Windell HummingbirdGollan's notes Stopped diet Mt Dews Increased salt in diet Feels better now  Anxiety has persisted Still full time at [employer] Likes the job and this is good (but no benefits) She finds she gets irritated when kids and grandkids are visiting  Uses the alprazolam in the morning and 1 to help sleep Variable but may have her mind is racing  Not depressed Does note some problems remembering things---gets distracted easily  Current Outpatient Prescriptions on File Prior to Visit  Medication Sig Dispense Refill  . ALPRAZolam (XANAX) 1 MG tablet TAKE ONE TABLET BY MOUTH TWICE DAILY AS NEEDED FOR ANXIETY 60 tablet 0  . FLUoxetine (PROZAC) 20 MG capsule TAKE ONE CAPSULE BY MOUTH EVERY DAY 90 capsule 3   No current facility-administered medications on file prior to visit.    Allergies  Allergen Reactions  . Penicillins     Shaking, vomiting  . Hydrocodone-Acetaminophen     REACTION: nausea  . Oxycodone-Acetaminophen     REACTION: nausea    Past Medical History  Diagnosis Date  . Anxiety 1990  . Depression 2004  . VD (venereal disease)     x 2  . Suicidal intent 2004    pills  . Insomnia   . Syncope and collapse     Past Surgical History  Procedure Laterality Date  . Abdominal hysterectomy  2005    endometriosis    Family History  Problem Relation Age of Onset  . Hypertension Mother   . Stroke Mother     x 2  . Obesity Mother   . Cancer Father     bladder, throat, and prostate  . Cancer Maternal Grandmother     lung    History   Social History  . Marital Status: Married    Spouse Name: N/A    Number of Children: 2  . Years of Education: N/A   Occupational History  . Server     [employer]   Social History Main Topics  . Smoking status: Never Smoker   .  Smokeless tobacco: Never Used  . Alcohol Use: No  . Drug Use: No  . Sexual Activity: Not on file   Other Topics Concern  . Not on file   Social History Narrative   Patient signed designated party release form and gives Jamal CollinShane Lender (spouse) [phone] access to medical records.   Review of Systems No drugs Marriage is good Appetite is fine    Objective:   Physical Exam  Constitutional: She appears well-developed and well-nourished. No distress.  Psychiatric: She has a normal mood and affect. Her behavior is normal.  Calm Appropriate appearance and speech            Assessment & Plan:

## 2014-05-26 NOTE — Telephone Encounter (Signed)
Approved: #60 x 0 

## 2014-05-26 NOTE — Telephone Encounter (Signed)
04/25/14 pt is on the schedule today at 3:45

## 2014-05-26 NOTE — Assessment & Plan Note (Signed)
Seems to be controlled No changes needed Urged her to try to cut alprazolam in past  Will defer drug screen  Still being dunned for $2K from the company

## 2014-05-26 NOTE — Progress Notes (Signed)
Pre visit review using our clinic review tool, if applicable. No additional management support is needed unless otherwise documented below in the visit note. 

## 2014-05-26 NOTE — Telephone Encounter (Signed)
rx called into pharmacy

## 2014-06-23 ENCOUNTER — Other Ambulatory Visit: Payer: Self-pay | Admitting: Internal Medicine

## 2014-06-23 NOTE — Telephone Encounter (Signed)
rx called into pharmacy

## 2014-06-23 NOTE — Telephone Encounter (Signed)
Approved: #60 x 0 

## 2014-06-23 NOTE — Telephone Encounter (Signed)
05/26/14 

## 2014-07-22 ENCOUNTER — Other Ambulatory Visit: Payer: Self-pay | Admitting: Internal Medicine

## 2014-07-22 NOTE — Telephone Encounter (Signed)
Last filled 06/23/14--last OV was 05/2014--please advise

## 2014-07-22 NOTE — Telephone Encounter (Signed)
Approved: #60 x 0 

## 2014-07-22 NOTE — Telephone Encounter (Signed)
Rx called in to pharmacy. 

## 2014-08-21 ENCOUNTER — Other Ambulatory Visit: Payer: Self-pay | Admitting: Internal Medicine

## 2014-08-22 NOTE — Telephone Encounter (Signed)
Called to [facility] [address] 

## 2014-08-22 NOTE — Telephone Encounter (Signed)
Received refill request electronically from pharmacy. Last refill 07/22/14 #60, last office 05/26/14. Is it okay to refill medication?

## 2014-08-22 NOTE — Telephone Encounter (Signed)
Ok to refill #60, 0 ref 

## 2014-09-21 ENCOUNTER — Other Ambulatory Visit: Payer: Self-pay | Admitting: Internal Medicine

## 2014-09-21 NOTE — Telephone Encounter (Signed)
Called to [facility] [address] 

## 2014-09-21 NOTE — Telephone Encounter (Signed)
08/22/14

## 2014-09-21 NOTE — Telephone Encounter (Signed)
Approved: #60 x 0 

## 2014-10-21 ENCOUNTER — Other Ambulatory Visit: Payer: Self-pay | Admitting: Internal Medicine

## 2014-10-21 NOTE — Telephone Encounter (Signed)
09/21/14 

## 2014-10-21 NOTE — Telephone Encounter (Signed)
Approved: #60 x 0 

## 2014-10-21 NOTE — Telephone Encounter (Signed)
rx called into pharmacy

## 2014-11-21 ENCOUNTER — Other Ambulatory Visit: Payer: Self-pay | Admitting: Internal Medicine

## 2014-11-21 NOTE — Telephone Encounter (Signed)
Approved: #60 x 0 

## 2014-11-21 NOTE — Telephone Encounter (Signed)
10/21/14 

## 2014-11-21 NOTE — Telephone Encounter (Signed)
rx called into pharmacy

## 2014-12-21 ENCOUNTER — Other Ambulatory Visit: Payer: Self-pay | Admitting: Internal Medicine

## 2014-12-21 NOTE — Telephone Encounter (Signed)
11/21/14 

## 2014-12-22 NOTE — Telephone Encounter (Signed)
rx called into pharmacy

## 2014-12-22 NOTE — Telephone Encounter (Signed)
Approved:okay #60 x 0 

## 2015-01-23 ENCOUNTER — Other Ambulatory Visit: Payer: Self-pay | Admitting: Internal Medicine

## 2015-01-23 NOTE — Telephone Encounter (Signed)
12/22/14

## 2015-01-23 NOTE — Telephone Encounter (Signed)
rx called into pharmacy

## 2015-01-23 NOTE — Telephone Encounter (Signed)
Approved:okay #60 x 0 

## 2015-02-23 ENCOUNTER — Other Ambulatory Visit: Payer: Self-pay | Admitting: Internal Medicine

## 2015-02-23 NOTE — Telephone Encounter (Signed)
01/23/15 

## 2015-02-23 NOTE — Telephone Encounter (Signed)
rx called into pharmacy

## 2015-02-23 NOTE — Telephone Encounter (Signed)
Approved:okay #60 x 0 

## 2015-03-24 ENCOUNTER — Other Ambulatory Visit: Payer: Self-pay | Admitting: Internal Medicine

## 2015-03-24 NOTE — Telephone Encounter (Signed)
rx called into pharmacy

## 2015-03-24 NOTE — Telephone Encounter (Signed)
Approved: #60 x 0 

## 2015-03-24 NOTE — Telephone Encounter (Signed)
02/23/15 

## 2015-04-21 ENCOUNTER — Other Ambulatory Visit: Payer: Self-pay | Admitting: Internal Medicine

## 2015-04-21 NOTE — Telephone Encounter (Signed)
Last f/u 05/2014 

## 2015-04-21 NOTE — Telephone Encounter (Signed)
Approved: #60 x 0 

## 2015-04-24 NOTE — Telephone Encounter (Signed)
rx called into pharmacy

## 2015-05-23 ENCOUNTER — Other Ambulatory Visit: Payer: Self-pay | Admitting: Internal Medicine

## 2015-05-23 NOTE — Telephone Encounter (Signed)
rx called into pharmacy

## 2015-05-23 NOTE — Telephone Encounter (Signed)
04/24/2015 

## 2015-05-23 NOTE — Telephone Encounter (Signed)
Approved: #60 x 0 

## 2015-05-29 ENCOUNTER — Ambulatory Visit: Payer: Self-pay | Admitting: Internal Medicine

## 2015-05-29 ENCOUNTER — Encounter: Payer: Self-pay | Admitting: Internal Medicine

## 2015-05-29 ENCOUNTER — Ambulatory Visit (INDEPENDENT_AMBULATORY_CARE_PROVIDER_SITE_OTHER): Payer: Self-pay | Admitting: Internal Medicine

## 2015-05-29 VITALS — BP 100/60 | HR 89 | Temp 98.3°F | Wt 107.0 lb

## 2015-05-29 DIAGNOSIS — F39 Unspecified mood [affective] disorder: Secondary | ICD-10-CM

## 2015-05-29 NOTE — Assessment & Plan Note (Signed)
Does have some mood cycling with ?hypomania and then irritable and depressed Not clear if she has bipolar 2 Has been doing well on the fluoxetine for some time though  Given her history of cocaine use in past--will check UDS

## 2015-05-29 NOTE — Progress Notes (Signed)
   Subjective:    Patient ID: Alexis Pierce, female    DOB: 10-09-1970, 44 y.o.   MRN: [id number]  HPI Here for follow up  Had recurring dental infections All top teeth pulled and now has full denture Now working on bottom teeth Not fitting well--had to take them out to eat Lost 26# since my last visit---thinks it started in March  Daughter got married last fall Financial issues ongoing She was stressed out planning this entire wedding  Is not doing drugs again Just using the fluoxetine and alprazolam On clinda and hydrocodone from the dentist  Mood up and down Happy and sad at times Irritable and arguing with husband--relates to stress with wedding and teeth, etc  Current Outpatient Prescriptions on File Prior to Visit  Medication Sig Dispense Refill  . ALPRAZolam (XANAX) 1 MG tablet TAKE ONE TABLET BY MOUTH TWICE DAILY AS NEEDED 60 tablet 0  . FLUoxetine (PROZAC) 20 MG capsule TAKE ONE CAPSULE BY MOUTH EVERY DAY 90 capsule 3   No current facility-administered medications on file prior to visit.    Allergies  Allergen Reactions  . Penicillins     Shaking, vomiting  . Hydrocodone-Acetaminophen     REACTION: nausea  . Oxycodone-Acetaminophen     REACTION: nausea    Past Medical History  Diagnosis Date  . Anxiety 1990  . Depression 2004  . VD (venereal disease)     x 2  . Suicidal intent 2004    pills  . Insomnia   . Syncope and collapse     Past Surgical History  Procedure Laterality Date  . Abdominal hysterectomy  2005    endometriosis    Family History  Problem Relation Age of Onset  . Hypertension Mother   . Stroke Mother     x 2  . Obesity Mother   . Cancer Father     bladder, throat, and prostate  . Cancer Maternal Grandmother     lung    Social History   Social History  . Marital Status: Married    Spouse Name: N/A  . Number of Children: 2  . Years of Education: N/A   Occupational History  . Server     [employer]   Social  History Main Topics  . Smoking status: Never Smoker   . Smokeless tobacco: Never Used  . Alcohol Use: No  . Drug Use: No  . Sexual Activity: Not on file   Other Topics Concern  . Not on file   Social History Narrative   Patient signed designated party release form and gives Alexis Pierce (spouse) [phone] access to medical records.   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

## 2015-05-29 NOTE — Progress Notes (Signed)
Pre visit review using our clinic review tool, if applicable. No additional management support is needed unless otherwise documented below in the visit note. 

## 2015-06-20 ENCOUNTER — Encounter: Payer: Self-pay | Admitting: Internal Medicine

## 2015-06-26 ENCOUNTER — Other Ambulatory Visit: Payer: Self-pay | Admitting: Internal Medicine

## 2015-06-26 NOTE — Telephone Encounter (Signed)
07/12/2014

## 2015-06-26 NOTE — Telephone Encounter (Signed)
rx called into pharmacy

## 2015-06-26 NOTE — Telephone Encounter (Signed)
Approved: #60 x 0 

## 2015-07-25 ENCOUNTER — Other Ambulatory Visit: Payer: Self-pay | Admitting: Internal Medicine

## 2015-07-25 NOTE — Telephone Encounter (Signed)
Approved: #60 x 0 

## 2015-07-25 NOTE — Telephone Encounter (Signed)
06/26/2015

## 2015-08-28 ENCOUNTER — Other Ambulatory Visit: Payer: Self-pay | Admitting: Internal Medicine

## 2015-08-28 MED ORDER — ALPRAZOLAM 1 MG PO TABS: 1.0000 mg | ORAL_TABLET | Freq: Two times a day (BID) | ORAL | Status: DC | PRN

## 2015-08-28 NOTE — Telephone Encounter (Signed)
Refill left on voice mail at pharmacy 

## 2015-08-28 NOTE — Telephone Encounter (Signed)
This was already called in

## 2015-08-28 NOTE — Addendum Note (Signed)
Addended by: Eual FinesBRIDGES, Milany Geck P on: 08/28/2015 12:05 PM   Modules accepted: Orders

## 2015-08-28 NOTE — Telephone Encounter (Signed)
Last refill 07-26-15 #60/0. Last OV 05-29-15. Next OV 09-11-15 Forwarding to Mayra ReelKate Clark in Dr Karle StarchLetvak's absence. Please send back to me when complete. Thanks

## 2015-09-11 ENCOUNTER — Encounter: Payer: Self-pay | Admitting: Internal Medicine

## 2015-09-28 ENCOUNTER — Other Ambulatory Visit: Payer: Self-pay | Admitting: Internal Medicine

## 2015-09-28 NOTE — Telephone Encounter (Signed)
Left refill on voice mail at pharmacy  

## 2015-09-28 NOTE — Telephone Encounter (Signed)
Last refill 08-28-15 #60/0 Last OV 05-29-15 Next OV 10-25-15. Can you approve in Dr Karle StarchLetvak's absence? Thanks!

## 2015-09-28 NOTE — Telephone Encounter (Signed)
Note from 05/2015 reviewed. UDS reviewed. Ok to phone in Xanax

## 2015-10-11 ENCOUNTER — Telehealth: Payer: Self-pay | Admitting: Internal Medicine

## 2015-10-11 NOTE — Telephone Encounter (Signed)
Pt called to cancel her cpx 10/25/15.  She stated she started new job and cannot miss any time for 90 days.  So she can get a raise.  She wanted to know if it would be ok to wait till sometime in aug or sept

## 2015-10-11 NOTE — Telephone Encounter (Signed)
August or September would be okay

## 2015-10-12 NOTE — Telephone Encounter (Signed)
Left message asking pt to call office  °

## 2015-10-16 NOTE — Telephone Encounter (Signed)
Appointment 8/3

## 2015-10-25 ENCOUNTER — Encounter: Payer: Self-pay | Admitting: Internal Medicine

## 2015-10-30 ENCOUNTER — Other Ambulatory Visit: Payer: Self-pay | Admitting: Internal Medicine

## 2015-10-30 NOTE — Telephone Encounter (Signed)
Approved: #60 x 0 

## 2015-10-30 NOTE — Telephone Encounter (Signed)
Last refill 09-29-15 #60/0 Last OV 05-29-15 Next OV 01-18-16 Pt cancelled 10-25-15 OV.

## 2015-10-30 NOTE — Telephone Encounter (Signed)
Left refill on voice mail at pharmacy  

## 2015-11-01 ENCOUNTER — Ambulatory Visit (INDEPENDENT_AMBULATORY_CARE_PROVIDER_SITE_OTHER): Payer: Commercial Managed Care - PPO | Admitting: Internal Medicine

## 2015-11-01 ENCOUNTER — Encounter: Payer: Self-pay | Admitting: Internal Medicine

## 2015-11-01 VITALS — BP 92/70 | HR 71 | Temp 98.2°F | Wt 120.0 lb

## 2015-11-01 DIAGNOSIS — H1013 Acute atopic conjunctivitis, bilateral: Secondary | ICD-10-CM | POA: Diagnosis not present

## 2015-11-01 DIAGNOSIS — H1010 Acute atopic conjunctivitis, unspecified eye: Secondary | ICD-10-CM | POA: Insufficient documentation

## 2015-11-01 MED ORDER — NAPHAZOLINE HCL 0.1 % OP SOLN: 2.0000 [drp] | Freq: Four times a day (QID) | OPHTHALMIC | Status: AC | PRN

## 2015-11-01 NOTE — Progress Notes (Signed)
   Subjective:    Patient ID: Alexis Pierce, female    DOB: 01-03-1971, 45 y.o.   MRN: [id number]  HPI Here due to eye problems Bothering her since 4 days ago Started with burning and pain ---redness and then discharge Some AM crusting This morning--swelling and redness underneath Thought it was allegies--tried oral benedryl without any improvement  Wears contacts continuously--but removed when eye symptoms started  No new makeup No new exposures  Current Outpatient Prescriptions on File Prior to Visit  Medication Sig Dispense Refill  . ALPRAZolam (XANAX) 1 MG tablet TAKE ONE TABLET BY MOUTH TWICE DAILY AS NEEDED 60 tablet 0  . FLUoxetine (PROZAC) 20 MG capsule TAKE ONE CAPSULE BY MOUTH EVERY DAY 90 capsule 3   No current facility-administered medications on file prior to visit.    Allergies  Allergen Reactions  . Penicillins     Shaking, vomiting  . Hydrocodone-Acetaminophen     REACTION: nausea  . Oxycodone-Acetaminophen     REACTION: nausea    Past Medical History  Diagnosis Date  . Anxiety 1990  . Depression 2004  . VD (venereal disease)     x 2  . Suicidal intent 2004    pills  . Insomnia   . Syncope and collapse     Past Surgical History  Procedure Laterality Date  . Abdominal hysterectomy  2005    endometriosis    Family History  Problem Relation Age of Onset  . Hypertension Mother   . Stroke Mother     x 2  . Obesity Mother   . Cancer Father     bladder, throat, and prostate  . Cancer Maternal Grandmother     lung    Social History   Social History  . Marital Status: Married    Spouse Name: N/A  . Number of Children: 2  . Years of Education: N/A   Occupational History  . Server     [employer]   Social History Main Topics  . Smoking status: Never Smoker   . Smokeless tobacco: Never Used  . Alcohol Use: No  . Drug Use: No  . Sexual Activity: Not on file   Other Topics Concern  . Not on file   Social History Narrative   Patient signed designated party release form and gives Alexis Pierce (spouse) [phone] access to medical records.   Review of Systems Still having trouble eating --takes out dentures and then can eat okay Has gained most of her weight back    Objective:   Physical Exam  Constitutional: She appears well-developed and well-nourished. No distress.  Eyes:  Very slight conjunctival injection No discharge Picture from this morning shows allergic shiners          Assessment & Plan:

## 2015-11-01 NOTE — Assessment & Plan Note (Signed)
Will treat with ocular antihistamine Will try empiric Rx with antibiotic if doesn't improve Will keep contacts out till better

## 2015-11-01 NOTE — Patient Instructions (Signed)
Please let me know if you are not better by Friday, I would send a prescription for an antibiotic--just in case.

## 2015-11-01 NOTE — Progress Notes (Signed)
Pre visit review using our clinic review tool, if applicable. No additional management support is needed unless otherwise documented below in the visit note. 

## 2015-11-02 ENCOUNTER — Telehealth: Payer: Self-pay | Admitting: *Deleted

## 2015-11-02 MED ORDER — SULFACETAMIDE SODIUM 10 % OP SOLN: 2.0000 [drp] | Freq: Four times a day (QID) | OPHTHALMIC | Status: DC

## 2015-11-02 NOTE — Telephone Encounter (Signed)
Pt notified Rx sent to pharmacy

## 2015-11-02 NOTE — Telephone Encounter (Signed)
Pt left voicemail at Triage. Pt is requesting an antibiotic eye drop, pt said the eye drops Dr. Alphonsus SiasLetvak prescribed yesterday are not helping. Pt said she woke up this morning and her eyes are worse. They are red and painful, pt request Rx sent to the [facility] on file

## 2015-11-02 NOTE — Telephone Encounter (Signed)
Please let her know that I sent another eye drop (antibiotic) for her to try.

## 2015-11-30 ENCOUNTER — Other Ambulatory Visit: Payer: Self-pay | Admitting: Internal Medicine

## 2015-11-30 NOTE — Telephone Encounter (Signed)
Last filled 10-30-15 #6- Last OV Acute 11-01-15 Next OV 01-18-16

## 2015-11-30 NOTE — Telephone Encounter (Signed)
Left refill on voice mail at pharmacy  

## 2015-11-30 NOTE — Telephone Encounter (Signed)
Approved: #60 x 0 

## 2015-12-29 ENCOUNTER — Other Ambulatory Visit: Payer: Self-pay | Admitting: Internal Medicine

## 2015-12-29 NOTE — Telephone Encounter (Signed)
Last filled 11-30-15 #60 Last OV 11-01-15 Next OV 01-18-16

## 2015-12-29 NOTE — Telephone Encounter (Signed)
Left refill on voice mail at pharmacy  

## 2015-12-29 NOTE — Telephone Encounter (Signed)
Approved: #60 x 0 

## 2016-01-11 ENCOUNTER — Emergency Department: Payer: Commercial Managed Care - PPO

## 2016-01-11 ENCOUNTER — Encounter: Payer: Self-pay | Admitting: Emergency Medicine

## 2016-01-11 ENCOUNTER — Emergency Department
Admission: EM | Admit: 2016-01-11 | Discharge: 2016-01-11 | Disposition: A | Discharge status: Home / Self Care | Payer: Commercial Managed Care - PPO | Attending: Student | Admitting: Student

## 2016-01-11 DIAGNOSIS — S93401A Sprain of unspecified ligament of right ankle, initial encounter: Secondary | ICD-10-CM | POA: Diagnosis not present

## 2016-01-11 DIAGNOSIS — Y92007 Garden or yard of unspecified non-institutional (private) residence as the place of occurrence of the external cause: Secondary | ICD-10-CM | POA: Insufficient documentation

## 2016-01-11 DIAGNOSIS — Y999 Unspecified external cause status: Secondary | ICD-10-CM | POA: Insufficient documentation

## 2016-01-11 DIAGNOSIS — W1842XA Slipping, tripping and stumbling without falling due to stepping into hole or opening, initial encounter: Secondary | ICD-10-CM | POA: Insufficient documentation

## 2016-01-11 DIAGNOSIS — Y939 Activity, unspecified: Secondary | ICD-10-CM | POA: Insufficient documentation

## 2016-01-11 DIAGNOSIS — S93601A Unspecified sprain of right foot, initial encounter: Secondary | ICD-10-CM | POA: Insufficient documentation

## 2016-01-11 DIAGNOSIS — S99921A Unspecified injury of right foot, initial encounter: Secondary | ICD-10-CM | POA: Diagnosis present

## 2016-01-11 DIAGNOSIS — X501XXA Overexertion from prolonged static or awkward postures, initial encounter: Secondary | ICD-10-CM | POA: Diagnosis not present

## 2016-01-11 MED ORDER — IBUPROFEN 600 MG PO TABS: 600.0000 mg | ORAL_TABLET | Freq: Three times a day (TID) | ORAL | 0 refills | Status: DC | PRN

## 2016-01-11 MED ORDER — TRAMADOL HCL 50 MG PO TABS: 50.0000 mg | ORAL_TABLET | Freq: Four times a day (QID) | ORAL | 0 refills | Status: DC | PRN

## 2016-01-11 MED ORDER — TRAMADOL HCL 50 MG PO TABS
50.0000 mg | ORAL_TABLET | Freq: Once | ORAL | Status: AC
  Administered 2016-01-11: 50 mg via ORAL
  Filled 2016-01-11: qty 1

## 2016-01-11 MED ORDER — IBUPROFEN 600 MG PO TABS
600.0000 mg | ORAL_TABLET | Freq: Once | ORAL | Status: AC
  Administered 2016-01-11: 600 mg via ORAL
  Filled 2016-01-11: qty 1

## 2016-01-11 NOTE — ED Provider Notes (Signed)
[facility] Emergency Department Provider Note   ____________________________________________   First MD Initiated Contact with Patient 01/11/16 [phone]     (approximate)  I have reviewed the triage vital signs and the nursing notes.   HISTORY  Chief Complaint Ankle Pain    HPI Alexis Pierce is a 45 y.o. female patient complaining of right lateral ankle and dorsal foot pain secondary to stepping over the last night. Patient also has edema to the dorsal aspect of the right foot. Patient denies loss sensation or loss of function. Patient states pain increase with weightbearing. No palliative measures taken for this complaint. Patient described the pain as sharp.Patient rates the pain as a 9/10.   Past Medical History:  Diagnosis Date  . Anxiety 1990  . Depression 2004  . Insomnia   . Suicidal intent 2004   pills  . Syncope and collapse   . VD (venereal disease)    x 2    Patient Active Problem List   Diagnosis Date Noted  . Allergic conjunctivitis 11/01/2015  . Syncope 01/03/2014  . CONTACT DERMATITIS 07/06/2008  . Anxiety 12/04/2006  . Mood disorder (HCC) 12/04/2006    Past Surgical History:  Procedure Laterality Date  . ABDOMINAL HYSTERECTOMY  2005   endometriosis    Prior to Admission medications   Medication Sig Start Date End Date Taking? Authorizing Provider  ALPRAZolam [name]) 1 MG tablet TAKE ONE TABLET BY MOUTH TWICE DAILY AS NEEDED 12/29/15   Karie Schwalbe, MD  FLUoxetine (PROZAC) 20 MG capsule TAKE ONE CAPSULE BY MOUTH EVERY DAY 09/15/12   Karie Schwalbe, MD  ibuprofen (ADVIL,MOTRIN) 600 MG tablet Take 1 tablet (600 mg total) by mouth every 8 (eight) hours as needed. 01/11/16   Joni Reining, PA-C  naphazoline (NAPHCON) 0.1 % ophthalmic solution Place 2 drops into both eyes 4 (four) times daily as needed for irritation. 11/01/15   Karie Schwalbe, MD  sulfacetamide (BLEPH-10) 10 % ophthalmic solution Place 2 drops into both  eyes 4 (four) times daily. 11/02/15   Karie Schwalbe, MD  traMADol (ULTRAM) 50 MG tablet Take 1 tablet (50 mg total) by mouth every 6 (six) hours as needed. 01/11/16 01/10/17  Joni Reining, PA-C    Allergies Penicillins; Hydrocodone-acetaminophen; and Oxycodone-acetaminophen  Family History  Problem Relation Age of Onset  . Hypertension Mother   . Stroke Mother     x 2  . Obesity Mother   . Cancer Father     bladder, throat, and prostate  . Cancer Maternal Grandmother     lung    Social History Social History  Substance Use Topics  . Smoking status: Never Smoker  . Smokeless tobacco: Never Used  . Alcohol use No    Review of Systems Constitutional: No fever/chills Eyes: No visual changes. ENT: No sore throat. Cardiovascular: Denies chest pain. Respiratory: Denies shortness of breath. Gastrointestinal: No abdominal pain.  No nausea, no vomiting.  No diarrhea.  No constipation. Genitourinary: Negative for dysuria. Musculoskeletal: Pain right foot and ankle Skin: Negative for rash. Neurological: Negative for headaches, focal weakness or numbness. Psychiatric:Anxiety and depression Allergic/Immunilogical: Penicillin and Percocet    ____________________________________________   PHYSICAL EXAM:  VITAL SIGNS: ED Triage Vitals [01/11/16 0853]  Enc Vitals Group     BP 97/63     Pulse Rate 86     Resp 18     Temp 98.1 F (36.7 C)     Temp Source Oral  SpO2 99 %     Weight 120 lb (54.4 kg)     Height  (1.6 m)     Head Circumference      Peak Flow      Pain Score 9     Pain Loc      Pain Edu?      Excl. in GC?     Constitutional: Alert and oriented. Well appearing and in no acute distress. Eyes: Conjunctivae are normal. PERRL. EOMI. Head: Atraumatic. Nose: No congestion/rhinnorhea. Mouth/Throat: Mucous membranes are moist.  Oropharynx non-erythematous. Neck: No stridor.  No cervical spine tenderness to  palpation. Hematological/Lymphatic/Immunilogical: No cervical lymphadenopathy. Cardiovascular: Normal rate, regular rhythm. Grossly normal heart sounds.  Good peripheral circulation. Respiratory: Normal respiratory effort.  No retractions. Lungs CTAB. Gastrointestinal: Soft and nontender. No distention. No abdominal bruits. No CVA tenderness. Musculoskeletal: No obvious deformity to the foot and ankle. There is edema and ecchymosis to the dorsal aspect of the foot.  Neurologic:  Normal speech and language. No gross focal neurologic deficits are appreciated. No gait instability. Skin:  Skin is warm, dry and intact. No rash noted. Edema and ecchymosis dose aspect the right foot Psychiatric: Mood and affect are normal. Speech and behavior are normal.  ____________________________________________   LABS (all labs ordered are listed, but only abnormal results are displayed)  Labs Reviewed - No data to display ____________________________________________  EKG   ____________________________________________  RADIOLOGY  No acute findings on x-ray of the right foot. I, Joni Reining, personally viewed and evaluated these images (plain radiographs) as part of my medical decision making, as well as reviewing the written report by the radiologist.  ____________________________________________   PROCEDURES  Procedure(s) performed: None  Procedures  Critical Care performed: No  ____________________________________________   INITIAL IMPRESSION / ASSESSMENT AND PLAN / ED COURSE  Pertinent labs & imaging results that were available during my care of the patient were reviewed by me and considered in my medical decision making (see chart for details).  Sprain right ankle and foot. Discuss x-ray finding with patient. Patient given discharge care instructions. Patient patient foot and ankle with Ace wrap and given crutches for ambulation. Patient given prescription for ibuprofen and  tramadol. Patient given a work note for 2 days. Patient advised follow-up family doctor. Condition persists.  Clinical Course     ____________________________________________   FINAL CLINICAL IMPRESSION(S) / ED DIAGNOSES  Final diagnoses:  Foot sprain, right, initial encounter  Right ankle sprain, initial encounter      NEW MEDICATIONS STARTED DURING THIS VISIT:  New Prescriptions   IBUPROFEN (ADVIL,MOTRIN) 600 MG TABLET    Take 1 tablet (600 mg total) by mouth every 8 (eight) hours as needed.   TRAMADOL (ULTRAM) 50 MG TABLET    Take 1 tablet (50 mg total) by mouth every 6 (six) hours as needed.     Note:  This document was prepared using Dragon voice recognition software and may include unintentional dictation errors.    Joni Reining, PA-C 01/11/16 1610    Gayla Doss, MD 01/11/16 [phone]

## 2016-01-11 NOTE — ED Notes (Signed)
States she stepped in a hole and felt a pop to right foot  Bruising noted across the top of foot  positive pulses

## 2016-01-11 NOTE — ED Triage Notes (Signed)
Patient presents to the ED with right ankle pain after accidentally stepping in a hole in her yard yesterday evening and twisting her ankle.  Patient ambulatory to triage with a limp.  No obvious distress at this time.

## 2016-01-18 ENCOUNTER — Encounter: Payer: Self-pay | Admitting: Internal Medicine

## 2016-01-18 ENCOUNTER — Ambulatory Visit (INDEPENDENT_AMBULATORY_CARE_PROVIDER_SITE_OTHER): Payer: Commercial Managed Care - PPO | Admitting: Internal Medicine

## 2016-01-18 VITALS — BP 98/68 | HR 60 | Temp 98.3°F | Ht 63.0 in | Wt 120.0 lb

## 2016-01-18 DIAGNOSIS — S93601A Unspecified sprain of right foot, initial encounter: Secondary | ICD-10-CM | POA: Insufficient documentation

## 2016-01-18 DIAGNOSIS — R208 Other disturbances of skin sensation: Secondary | ICD-10-CM

## 2016-01-18 DIAGNOSIS — Z0000 Encounter for general adult medical examination without abnormal findings: Secondary | ICD-10-CM | POA: Insufficient documentation

## 2016-01-18 DIAGNOSIS — F39 Unspecified mood [affective] disorder: Secondary | ICD-10-CM

## 2016-01-18 DIAGNOSIS — Z0001 Encounter for general adult medical examination with abnormal findings: Secondary | ICD-10-CM

## 2016-01-18 DIAGNOSIS — R200 Anesthesia of skin: Secondary | ICD-10-CM | POA: Insufficient documentation

## 2016-01-18 NOTE — Assessment & Plan Note (Signed)
Vague but will just check labs

## 2016-01-18 NOTE — Progress Notes (Signed)
Subjective:    Patient ID: Alexis Pierce, female    DOB: Jun 11, 1971, 45 y.o.   MRN: [id number]  HPI Here for physical  Recent sprain of right ankle Using crutches Now trying some weight bearing--without crutches Seems to swell more with the ACE then without it  Still on fluoxetine Takes the alprazolam bid --- 7AM and 6:30PM ("to wind me down and help me sleep") Mood okay Not working currently---may look for part time work again  Current Outpatient Prescriptions on File Prior to Visit  Medication Sig Dispense Refill  . ALPRAZolam (XANAX) 1 MG tablet TAKE ONE TABLET BY MOUTH TWICE DAILY AS NEEDED 60 tablet 0  . FLUoxetine (PROZAC) 20 MG capsule TAKE ONE CAPSULE BY MOUTH EVERY DAY 90 capsule 3  . naphazoline (NAPHCON) 0.1 % ophthalmic solution Place 2 drops into both eyes 4 (four) times daily as needed for irritation. 15 mL 3  . sulfacetamide (BLEPH-10) 10 % ophthalmic solution Place 2 drops into both eyes 4 (four) times daily. 15 mL 0  . traMADol (ULTRAM) 50 MG tablet Take 1 tablet (50 mg total) by mouth every 6 (six) hours as needed. 20 tablet 0   No current facility-administered medications on file prior to visit.     Allergies  Allergen Reactions  . Ibuprofen Nausea And Vomiting    Higher Doses 600mg  or more  . Penicillins     Shaking, vomiting  . Hydrocodone-Acetaminophen     REACTION: nausea  . Oxycodone-Acetaminophen     REACTION: nausea    Past Medical History:  Diagnosis Date  . Anxiety 1990  . Depression 2004  . Insomnia   . Suicidal intent 2004   pills  . Syncope and collapse   . VD (venereal disease)    x 2    Past Surgical History:  Procedure Laterality Date  . ABDOMINAL HYSTERECTOMY  2005   endometriosis    Family History  Problem Relation Age of Onset  . Hypertension Mother   . Stroke Mother     x 2  . Obesity Mother   . Cancer Father     bladder, throat, and prostate  . Cancer Maternal Grandmother     lung    Social History    Social History  . Marital status: Married    Spouse name: N/A  . Number of children: 2  . Years of education: N/A   Occupational History  . Not working currently         Social History Main Topics  . Smoking status: Never Smoker  . Smokeless tobacco: Never Used  . Alcohol use No  . Drug use: No  . Sexual activity: Not on file   Other Topics Concern  . Not on file   Social History Narrative   Patient signed designated party release form and gives Alicia Reinhold (spouse) [phone] access to medical records.   Review of Systems  Constitutional: Negative for fatigue and unexpected weight change.       Wears seat belt  HENT: Positive for dental problem. Negative for hearing loss and tinnitus.        Needed dentures redone--able to eat better  Eyes: Negative for visual disturbance.       No diplopia or unilateral vision loss  Respiratory: Negative for cough, chest tightness and shortness of breath.   Cardiovascular: Negative for chest pain, palpitations and leg swelling.  Gastrointestinal: Negative for abdominal pain, blood in stool, constipation, nausea and vomiting.  Endocrine: Negative for  polydipsia and polyuria.  Genitourinary: Negative for difficulty urinating, dyspareunia, dysuria and hematuria.  Musculoskeletal: Negative for arthralgias, back pain and joint swelling.  Skin: Negative for rash.       No suspicious lesions  Allergic/Immunologic: Positive for environmental allergies. Negative for immunocompromised state.       Uses OTC  Neurological: Negative for dizziness, syncope, weakness and light-headedness.       Occ mild headache Some abnormal sensations in feet--?related to drinking sugared drinks  Hematological: Negative for adenopathy. Bruises/bleeds easily.  Psychiatric/Behavioral: Positive for sleep disturbance. Negative for dysphoric mood. The patient is nervous/anxious.        Usually doesn't get over 6 hours sleep per night       Objective:   Physical  Exam  Constitutional: She is oriented to person, place, and time. She appears well-developed and well-nourished. No distress.  HENT:  Head: Normocephalic and atraumatic.  Right Ear: External ear normal.  Left Ear: External ear normal.  Mouth/Throat: Oropharynx is clear and moist. No oropharyngeal exudate.  Eyes: Conjunctivae are normal. Pupils are equal, round, and reactive to light.  Neck: Normal range of motion. Neck supple. No thyromegaly present.  Cardiovascular: Normal rate, regular rhythm, normal heart sounds and intact distal pulses.  Exam reveals no gallop.   No murmur heard. Pulmonary/Chest: Breath sounds normal. No respiratory distress. She has no wheezes. She has no rales.  Abdominal: Soft. There is no tenderness.  Musculoskeletal: She exhibits no edema.  Pain along lateral mid foot on right--not really the ankle   Lymphadenopathy:    She has no cervical adenopathy.  Neurological: She is alert and oriented to person, place, and time.  Skin: No rash noted. No erythema.  Psychiatric: She has a normal mood and affect. Her behavior is normal.          Assessment & Plan:

## 2016-01-18 NOTE — Assessment & Plan Note (Signed)
Not too bad Discussed ACE and use crutches till pain is much better tylenol

## 2016-01-18 NOTE — Assessment & Plan Note (Signed)
Mostly anxiety Will continue meds Checked CSRS--- only me except for tramadol from ER last week

## 2016-01-18 NOTE — Patient Instructions (Signed)
You can set up your own screening mammogram if you decide to do it. (it is definitely recommended at age 45)

## 2016-01-18 NOTE — Assessment & Plan Note (Signed)
Yearly flu vaccine Discussed mammogram--she will consider Colon screening at 49

## 2016-01-18 NOTE — Progress Notes (Signed)
Pre visit review using our clinic review tool, if applicable. No additional management support is needed unless otherwise documented below in the visit note. 

## 2016-01-19 LAB — COMPREHENSIVE METABOLIC PANEL
ALBUMIN: 4.5 g/dL (ref 3.5–5.2)
ALT: 16 U/L (ref 0–35)
AST: 24 U/L (ref 0–37)
Alkaline Phosphatase: 56 U/L (ref 39–117)
BUN: 7 mg/dL (ref 6–23)
CALCIUM: 10 mg/dL (ref 8.4–10.5)
CHLORIDE: 104 meq/L (ref 96–112)
CO2: 30 meq/L (ref 19–32)
Creatinine, Ser: 0.9 mg/dL (ref 0.40–1.20)
GFR: 71.64 mL/min (ref >60.00)
Glucose, Bld: 99 mg/dL (ref 70–99)
POTASSIUM: 3.8 meq/L (ref 3.5–5.1)
Sodium: 141 mEq/L (ref 135–145)
TOTAL PROTEIN: 7.3 g/dL (ref 6.0–8.3)
Total Bilirubin: 0.5 mg/dL (ref 0.2–1.2)

## 2016-01-19 LAB — CBC WITH DIFFERENTIAL/PLATELET
BASOS ABS: 0 10*3/uL (ref 0.0–0.1)
Basophils Relative: 0.3 % (ref 0.0–3.0)
EOS PCT: 4.6 % (ref 0.0–5.0)
Eosinophils Absolute: 0.3 10*3/uL (ref 0.0–0.7)
HEMATOCRIT: 35.4 % — AB (ref 36.0–46.0)
HEMOGLOBIN: 12.1 g/dL (ref 12.0–15.0)
LYMPHS ABS: 1.6 10*3/uL (ref 0.7–4.0)
LYMPHS PCT: 23.5 % (ref 12.0–46.0)
MCHC: 34.3 g/dL (ref 30.0–36.0)
MCV: 90.4 fl (ref 78.0–100.0)
MONOS PCT: 7.2 % (ref 3.0–12.0)
Monocytes Absolute: 0.5 10*3/uL (ref 0.1–1.0)
NEUTROS PCT: 64.4 % (ref 43.0–77.0)
Neutro Abs: 4.3 10*3/uL (ref 1.4–7.7)
Platelets: 287 10*3/uL (ref 150.0–400.0)
RBC: 3.92 Mil/uL (ref 3.87–5.11)
RDW: 12.7 % (ref 11.5–15.5)
WBC: 6.7 10*3/uL (ref 4.0–10.5)

## 2016-01-19 LAB — LIPID PANEL
CHOLESTEROL: 183 mg/dL (ref 0–200)
HDL: 73.1 mg/dL (ref >39.00)
LDL Cholesterol: 99 mg/dL (ref 0–99)
NONHDL: 110.02
Total CHOL/HDL Ratio: 3
Triglycerides: 55 mg/dL (ref 0.0–149.0)
VLDL: 11 mg/dL (ref 0.0–40.0)

## 2016-01-19 LAB — T4, FREE: FREE T4: 0.69 ng/dL (ref 0.60–1.60)

## 2016-01-19 LAB — VITAMIN B12: VITAMIN B 12: 209 pg/mL — AB (ref 211–911)

## 2016-01-20 ENCOUNTER — Other Ambulatory Visit: Payer: Self-pay | Admitting: Internal Medicine

## 2016-01-20 DIAGNOSIS — E538 Deficiency of other specified B group vitamins: Secondary | ICD-10-CM

## 2016-01-23 ENCOUNTER — Telehealth: Payer: Self-pay

## 2016-01-23 NOTE — Telephone Encounter (Signed)
Pt needs letter stating pt had annual physical on 01/18/16. Advised pt done and pt will pick up at front desk today.letter at front desk; pt voiced understanding.

## 2016-01-29 ENCOUNTER — Other Ambulatory Visit: Payer: Self-pay | Admitting: Internal Medicine

## 2016-01-29 NOTE — Telephone Encounter (Signed)
Left refill on voice mail at pharmacy  

## 2016-01-29 NOTE — Telephone Encounter (Signed)
Last filled 12-29-15 #60 Last OV 01-18-16 Next OV 07-22-16

## 2016-01-29 NOTE — Telephone Encounter (Signed)
Approved: #60 x 0 

## 2016-02-29 ENCOUNTER — Other Ambulatory Visit: Payer: Self-pay | Admitting: Internal Medicine

## 2016-02-29 NOTE — Telephone Encounter (Signed)
Last filled 01-29-16 #60 Last OV 01-18-16 Next OV 07-22-16

## 2016-02-29 NOTE — Telephone Encounter (Signed)
Approved: #60 x 0 

## 2016-02-29 NOTE — Telephone Encounter (Signed)
Left refill on voice mail at pharmacy  

## 2016-04-02 ENCOUNTER — Other Ambulatory Visit: Payer: Self-pay | Admitting: Internal Medicine

## 2016-04-02 NOTE — Telephone Encounter (Signed)
Last filled 02-29-16 #60 Last OV 01-18-16 Next OV 07-22-16

## 2016-04-02 NOTE — Telephone Encounter (Signed)
Approved: #60 x 0 

## 2016-04-02 NOTE — Telephone Encounter (Signed)
Left refill on voice mail at pharmacy  

## 2016-05-02 ENCOUNTER — Other Ambulatory Visit: Payer: Self-pay | Admitting: Internal Medicine

## 2016-05-02 NOTE — Telephone Encounter (Signed)
Left refill on voice mail at pharmacy  

## 2016-05-02 NOTE — Telephone Encounter (Signed)
Approved: #60 x 0 

## 2016-05-02 NOTE — Telephone Encounter (Signed)
Last filled 04-02-16 #60 Last OV 01-18-16 Next OV 07-22-16

## 2016-05-30 ENCOUNTER — Other Ambulatory Visit: Payer: Self-pay | Admitting: Internal Medicine

## 2016-05-30 NOTE — Telephone Encounter (Signed)
Left refill on voice mail at pharmacy  

## 2016-05-30 NOTE — Telephone Encounter (Signed)
I have been calling the pharmacy and the line is busy

## 2016-05-30 NOTE — Telephone Encounter (Signed)
Last filled 05-02-16 #60 Last OV 01-18-16 Next OV 07-22-16  Forwarding to Dr Dayton MartesAron in Dr Karle StarchLetvak's absence

## 2016-07-01 ENCOUNTER — Other Ambulatory Visit: Payer: Self-pay | Admitting: Internal Medicine

## 2016-07-01 NOTE — Telephone Encounter (Signed)
Last filled 05-30-16 #60 Last OV  01-18-16 Next OV 07-22-16

## 2016-07-01 NOTE — Telephone Encounter (Signed)
Left refill on voice mail at pharmacy  

## 2016-07-01 NOTE — Telephone Encounter (Signed)
Approved: #60 x 0 

## 2016-07-22 ENCOUNTER — Ambulatory Visit: Payer: Commercial Managed Care - PPO | Admitting: Internal Medicine

## 2016-07-25 ENCOUNTER — Ambulatory Visit (INDEPENDENT_AMBULATORY_CARE_PROVIDER_SITE_OTHER): Payer: Self-pay | Admitting: Internal Medicine

## 2016-07-25 ENCOUNTER — Encounter: Payer: Self-pay | Admitting: Internal Medicine

## 2016-07-25 VITALS — BP 100/78 | HR 74 | Temp 98.3°F | Wt 120.0 lb

## 2016-07-25 DIAGNOSIS — F39 Unspecified mood [affective] disorder: Secondary | ICD-10-CM

## 2016-07-25 DIAGNOSIS — J0110 Acute frontal sinusitis, unspecified: Secondary | ICD-10-CM

## 2016-07-25 MED ORDER — DOXYCYCLINE HYCLATE 100 MG PO TABS: 100.0000 mg | ORAL_TABLET | Freq: Two times a day (BID) | ORAL | 0 refills | Status: DC

## 2016-07-25 MED ORDER — AMOXICILLIN 500 MG PO TABS: 1000.0000 mg | ORAL_TABLET | Freq: Two times a day (BID) | ORAL | 0 refills | Status: DC

## 2016-07-25 NOTE — Assessment & Plan Note (Signed)
Doing well on meds Will continue

## 2016-07-25 NOTE — Progress Notes (Signed)
Pre visit review using our clinic review tool, if applicable. No additional management support is needed unless otherwise documented below in the visit note. 

## 2016-07-25 NOTE — Progress Notes (Signed)
Subjective:    Patient ID: Alexis Pierce, female    DOB: [birth year], 46 y.o.   MRN: [id number]  HPI Here for follow up of her mood issues Husband moved jobs--so not on insurance again  No longer working at [employer] Watching grandchild-- she is ~6 months Also has grandson about the same age She enjoys this  Anxiety has been controlled No temptations for drugs No depression Not a great sleeper--nothing new  Has had cough for about 2 weeks Sinus drainage--post nasal Worse at night Only slight sputum No fever now--did have one 3 days ago (low grade) No SOB Some dizziness--if jumps up Some headache--frontal and posterior neck No ear pain--- but hearing goes in and out some Taking OTC cold med--helps a little  Current Outpatient Prescriptions on File Prior to Visit  Medication Sig Dispense Refill  . acetaminophen (TYLENOL) 500 MG tablet Take 500 mg by mouth every 6 (six) hours as needed.    . ALPRAZolam (XANAX) 1 MG tablet TAKE ONE TABLET BY MOUTH TWICE DAILY AS NEEDED 60 tablet 0  . FLUoxetine (PROZAC) 20 MG capsule TAKE ONE CAPSULE BY MOUTH EVERY DAY 90 capsule 3  . naphazoline (NAPHCON) 0.1 % ophthalmic solution Place 2 drops into both eyes 4 (four) times daily as needed for irritation. 15 mL 3   No current facility-administered medications on file prior to visit.     Allergies  Allergen Reactions  . Ibuprofen Nausea And Vomiting    Higher Doses 600mg  or more  . Penicillins     Shaking, vomiting  . Hydrocodone-Acetaminophen     REACTION: nausea  . Oxycodone-Acetaminophen     REACTION: nausea    Past Medical History:  Diagnosis Date  . Anxiety 1990  . Depression 2004  . Insomnia   . Suicidal intent 2004   pills  . Syncope and collapse   . VD (venereal disease)    x 2    Past Surgical History:  Procedure Laterality Date  . ABDOMINAL HYSTERECTOMY  2005   endometriosis    Family History  Problem Relation Age of Onset  . Hypertension Mother   . Stroke  Mother     x 2  . Obesity Mother   . Cancer Father     bladder, throat, and prostate  . Cancer Maternal Grandmother     lung    Social History   Social History  . Marital status: Married    Spouse name: N/A  . Number of children: 2  . Years of education: N/A   Occupational History  . Not working currently         Social History Main Topics  . Smoking status: Never Smoker  . Smokeless tobacco: Never Used  . Alcohol use No  . Drug use: No  . Sexual activity: Not on file   Other Topics Concern  . Not on file   Social History Narrative   Patient signed designated party release form and gives Alexis Pierce (spouse) [phone] access to medical records.   Review of Systems Appetite is okay Teeth don't fit well--- eats with them out. May look into getting posts Weight stable    Objective:   Physical Exam  Constitutional: She appears well-nourished. No distress.  HENT:  Mild frontal tenderness Moderate nasal congestion TMs normal Slight pharyngeal injection without tonsillar enlargement or exudates  Neck: No thyromegaly present.  Pulmonary/Chest: Effort normal and breath sounds normal. No respiratory distress. She has no wheezes. She has no rales.  Lymphadenopathy:    She has no cervical adenopathy.  Psychiatric: She has a normal mood and affect. Her behavior is normal.          Assessment & Plan:

## 2016-07-25 NOTE — Patient Instructions (Signed)
Please start the antibiotic only if you are worsening in the next few days. 

## 2016-07-25 NOTE — Assessment & Plan Note (Addendum)
Going on 2 weeks Discussed observation for now--start doxy if worsens

## 2016-08-01 ENCOUNTER — Other Ambulatory Visit: Payer: Self-pay | Admitting: Internal Medicine

## 2016-08-01 NOTE — Telephone Encounter (Signed)
Last filled 07-01-16 #60 Last OV  Acute 07-25-16 Next OV 01-23-17

## 2016-08-01 NOTE — Telephone Encounter (Signed)
Left refill on voice mail at pharmacy  

## 2016-08-01 NOTE — Telephone Encounter (Signed)
Approved: #60 x 0 

## 2016-08-31 ENCOUNTER — Other Ambulatory Visit: Payer: Self-pay | Admitting: Internal Medicine

## 2016-09-02 NOTE — Telephone Encounter (Signed)
Approved: #60 x 0 

## 2016-09-02 NOTE — Telephone Encounter (Signed)
Last Rx 08/01/2016. Last OV 01/2016-CPE

## 2016-09-03 ENCOUNTER — Other Ambulatory Visit: Payer: Self-pay | Admitting: Internal Medicine

## 2016-09-03 NOTE — Telephone Encounter (Signed)
Rx called in to requested pharmacy 

## 2016-10-07 ENCOUNTER — Other Ambulatory Visit: Payer: Self-pay | Admitting: Internal Medicine

## 2016-10-07 NOTE — Telephone Encounter (Signed)
Last Rx 09/03/2016. Last OV 07/2016. pls advise

## 2016-10-07 NOTE — Telephone Encounter (Signed)
Approved: #60 x 0 

## 2016-10-08 NOTE — Telephone Encounter (Signed)
Left refill on voice mail at pharmacy  

## 2016-11-07 ENCOUNTER — Other Ambulatory Visit: Payer: Self-pay | Admitting: Internal Medicine

## 2016-11-07 NOTE — Telephone Encounter (Signed)
Letvak pt... Last filled 10/08/16... Please advise

## 2016-11-07 NOTE — Telephone Encounter (Signed)
Please call in.  Thanks.   

## 2016-11-08 NOTE — Telephone Encounter (Signed)
Medication phoned to pharmacy.  

## 2016-12-10 ENCOUNTER — Other Ambulatory Visit: Payer: Self-pay | Admitting: Internal Medicine

## 2016-12-10 NOTE — Telephone Encounter (Signed)
Left refill on voice mail at pharmacy  

## 2016-12-10 NOTE — Telephone Encounter (Signed)
Approved: #60 x 0 

## 2016-12-10 NOTE — Telephone Encounter (Signed)
Last filled 10-08-16 #60 Last OV 2--8-18 Next OV 01-23-17

## 2017-01-08 ENCOUNTER — Other Ambulatory Visit: Payer: Self-pay

## 2017-01-08 MED ORDER — ALPRAZOLAM 1 MG PO TABS: 1.0000 mg | ORAL_TABLET | Freq: Two times a day (BID) | ORAL | 0 refills | Status: DC | PRN

## 2017-01-08 NOTE — Telephone Encounter (Signed)
Left refill on voice mail at pharmacy  

## 2017-01-08 NOTE — Telephone Encounter (Signed)
Pt left v/m; pt going to convention on 01/09/17 and request refill today for xanax. Last refilled # 60 on 12/10/16. Last seen on 07/25/16. Dr Alphonsus SiasLetvak out of office.Please advise.

## 2017-01-23 ENCOUNTER — Ambulatory Visit: Payer: Self-pay | Admitting: Internal Medicine

## 2017-02-06 ENCOUNTER — Other Ambulatory Visit: Payer: Self-pay | Admitting: Internal Medicine

## 2017-02-06 NOTE — Telephone Encounter (Signed)
Approved: #60 x 0 

## 2017-02-06 NOTE — Telephone Encounter (Signed)
Called pharmacy and spoke with Judeth Cornfield and gave verbal refill. Nothing further is needed

## 2017-02-06 NOTE — Telephone Encounter (Signed)
Please advise on refill Last filled by Dr. Dayton Martes 01/08/17 QTY 60 RF 0 Last Ov 07/25/16

## 2017-02-07 ENCOUNTER — Ambulatory Visit: Payer: Self-pay | Admitting: Internal Medicine

## 2017-02-11 ENCOUNTER — Ambulatory Visit: Payer: Self-pay | Admitting: Internal Medicine

## 2017-03-07 ENCOUNTER — Other Ambulatory Visit: Payer: Self-pay | Admitting: Internal Medicine

## 2017-03-08 ENCOUNTER — Other Ambulatory Visit: Payer: Self-pay | Admitting: Internal Medicine

## 2017-03-10 NOTE — Telephone Encounter (Signed)
Last filled 02/06/17... Please advise 

## 2017-03-11 NOTE — Telephone Encounter (Signed)
Yes that is true. Okay to send Rx for #20 x 0 and no more until she is in the office

## 2017-03-11 NOTE — Telephone Encounter (Signed)
Pt left v/m requesting cb about alprazolam refill; pt has not had since 03/09/17 and pt is feeling sick.

## 2017-03-11 NOTE — Telephone Encounter (Signed)
Pt cancelled her last 3 appointments and No Showed in August. If I remember correctly, you said no refills until she makes OV.

## 2017-03-11 NOTE — Telephone Encounter (Signed)
Refill left on vm at pharmacy, per Dr. Alphonsus Sias.

## 2017-03-11 NOTE — Telephone Encounter (Signed)
Approved: #60 x 0 

## 2017-03-20 ENCOUNTER — Ambulatory Visit (INDEPENDENT_AMBULATORY_CARE_PROVIDER_SITE_OTHER): Payer: Self-pay | Admitting: Internal Medicine

## 2017-03-20 ENCOUNTER — Encounter: Payer: Self-pay | Admitting: Internal Medicine

## 2017-03-20 VITALS — BP 110/64 | HR 90 | Temp 98.1°F | Wt 107.0 lb

## 2017-03-20 DIAGNOSIS — F321 Major depressive disorder, single episode, moderate: Secondary | ICD-10-CM | POA: Insufficient documentation

## 2017-03-20 MED ORDER — SERTRALINE HCL 50 MG PO TABS: 50.0000 mg | ORAL_TABLET | Freq: Every day | ORAL | 3 refills | Status: DC

## 2017-03-20 MED ORDER — ALPRAZOLAM 1 MG PO TABS: 1.0000 mg | ORAL_TABLET | Freq: Two times a day (BID) | ORAL | 0 refills | Status: DC | PRN

## 2017-03-20 NOTE — Assessment & Plan Note (Signed)
Tough situation at home but doesn't feel threatened Gave information for phone contact Will try sertraline and see back in 3 weeks or so

## 2017-03-20 NOTE — Progress Notes (Signed)
   Subjective:    Patient ID: Alexis Pierce, female    DOB: 04-Dec-1970, 46 y.o.   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 acetaminophen (TYLENOL) 500 MG tablet Take 500 mg by mouth every 6 (six) hours as needed.    . ALPRAZolam (XANAX) 1 MG tablet TAKE 1 TABLET BY MOUTH TWICE DAILY AS NEEDED 20 tablet 0  . naphazoline (NAPHCON) 0.1 % ophthalmic solution Place 2 drops into both eyes 4 (four) times daily as needed for irritation. 15 mL 3   No current facility-administered medications on file prior to visit.     Allergies  Allergen Reactions  . Ibuprofen Nausea And Vomiting    Higher Doses  or more  . Penicillins     Shaking, vomiting  . Hydrocodone-Acetaminophen     REACTION: nausea  . Oxycodone-Acetaminophen     REACTION: nausea    Past Medical History:  Diagnosis Date  . Anxiety 1990  . Depression 2004  . Insomnia   . Suicidal intent 2004   pills  . Syncope and collapse   . VD (venereal disease)    x 2    Past Surgical History:  Procedure Laterality Date  . ABDOMINAL HYSTERECTOMY  2005   endometriosis    Family History  Problem Relation Age of Onset  . Hypertension Mother   . Stroke Mother    x 2  . Obesity Mother   . Cancer Father        bladder, throat, and prostate  . Cancer Maternal Grandmother        lung    Social History   Social History  . Marital status: Married    Spouse name: N/A  . Number of children: 2  . Years of education: N/A   Occupational History  . Not working currently         Social History Main Topics  . Smoking status: Never Smoker  . Smokeless tobacco: Never Used  . Alcohol use No  . Drug use: No  . Sexual activity: Not on file   Other Topics Concern  . Not on file   Social History Narrative   Patient signed designated party release form and gives Reniah Cottingham (spouse) [phone] access to medical records.   Review of Systems Teeth still don't fit right Not sleeping well Appetite is poor--has lost 13#    Objective:   Physical Exam  Constitutional:  tearful  Psychiatric:  Depressed mood but appropriate appearance and speech Normal affect  No thought process disturbances          Assessment & Plan:

## 2017-04-09 ENCOUNTER — Ambulatory Visit: Payer: Self-pay | Admitting: Internal Medicine

## 2017-04-11 ENCOUNTER — Ambulatory Visit: Payer: Self-pay | Admitting: Internal Medicine

## 2017-04-15 ENCOUNTER — Ambulatory Visit: Payer: Self-pay | Admitting: Internal Medicine

## 2017-04-15 DIAGNOSIS — Z0289 Encounter for other administrative examinations: Secondary | ICD-10-CM

## 2017-04-21 ENCOUNTER — Other Ambulatory Visit: Payer: Self-pay | Admitting: Internal Medicine

## 2017-04-23 ENCOUNTER — Other Ambulatory Visit: Payer: Self-pay | Admitting: Internal Medicine

## 2017-05-06 ENCOUNTER — Telehealth: Payer: Self-pay | Admitting: Internal Medicine

## 2017-05-06 MED ORDER — ALPRAZOLAM 1 MG PO TABS: 1.0000 mg | ORAL_TABLET | Freq: Two times a day (BID) | ORAL | 0 refills | Status: DC | PRN

## 2017-05-06 NOTE — Telephone Encounter (Signed)
Last refill 03/20/17 #60 Last office visit same date

## 2017-05-06 NOTE — Telephone Encounter (Signed)
Left refill on voice mail at pharmacy  

## 2017-05-06 NOTE — Telephone Encounter (Signed)
Pt. Requesting controlled medication. Thanks.

## 2017-05-06 NOTE — Telephone Encounter (Signed)
Copied from CRM [phone]. Topic: Quick Communication - See Telephone Encounter >> May 06, 2017  8:43 AM Oneal GroutSebastian, Jennifer S wrote: CRM for notification. See Telephone encounter for:  05/06/17.

## 2017-05-06 NOTE — Telephone Encounter (Signed)
Patient is needing a refill on xanax, provider does not have appt till next week, patient states she can't wait that long.

## 2017-05-06 NOTE — Telephone Encounter (Signed)
Okay #60 x 0 

## 2017-06-03 ENCOUNTER — Other Ambulatory Visit: Payer: Self-pay | Admitting: Internal Medicine

## 2017-06-03 NOTE — Telephone Encounter (Signed)
Last Rx 05/06/2017. Last OV 03/2017

## 2017-06-03 NOTE — Telephone Encounter (Signed)
Rx called in to requested pharmacy 

## 2017-06-03 NOTE — Telephone Encounter (Signed)
Approved: #60 x 0 

## 2017-07-03 ENCOUNTER — Other Ambulatory Visit: Payer: Self-pay | Admitting: Internal Medicine

## 2017-07-03 NOTE — Telephone Encounter (Signed)
Ok to refill? Alprazolam (XANAX) 1 MG tablet #60 with 0 refill. Last prescribed on 06/03/2017. Last seen on 03/20/2017.

## 2017-07-31 ENCOUNTER — Other Ambulatory Visit: Payer: Self-pay | Admitting: Internal Medicine

## 2017-07-31 NOTE — Telephone Encounter (Signed)
We go way back and she has no insurance. She is not doing well in October---please make sure things are better and find out if she is on the sertraline still. She should come in soon---I will downcode so it is not even $80

## 2017-07-31 NOTE — Telephone Encounter (Signed)
Left message to call office

## 2017-07-31 NOTE — Telephone Encounter (Signed)
Last filled: 07/03/17 #60 Last OV: 03/20/17 No future OV. Cancel and no-show 3 times.

## 2017-08-06 NOTE — Telephone Encounter (Addendum)
Left another message to call office. I sent her a MyChart message, also.

## 2017-08-13 NOTE — Telephone Encounter (Signed)
Mailed her a letter 

## 2017-08-13 NOTE — Telephone Encounter (Signed)
Left another message to call the office to schedule an appt

## 2017-08-28 ENCOUNTER — Ambulatory Visit: Payer: Self-pay | Admitting: Internal Medicine

## 2017-08-28 ENCOUNTER — Other Ambulatory Visit: Payer: Self-pay | Admitting: Internal Medicine

## 2017-08-28 NOTE — Telephone Encounter (Signed)
Last filled 07-31-17 #60 Last OV 03-20-17 Next OV tomorrow. (Was scheduled today)

## 2017-08-29 ENCOUNTER — Ambulatory Visit: Payer: Self-pay | Admitting: Internal Medicine

## 2017-08-29 DIAGNOSIS — Z0289 Encounter for other administrative examinations: Secondary | ICD-10-CM

## 2017-08-29 NOTE — Telephone Encounter (Signed)
Obviously canceled Find out how she is doing, if she is still on the sertaline, etc

## 2017-09-03 ENCOUNTER — Encounter: Payer: Self-pay | Admitting: Internal Medicine

## 2017-09-03 ENCOUNTER — Telehealth: Payer: Self-pay

## 2017-09-03 ENCOUNTER — Ambulatory Visit: Payer: Self-pay | Admitting: Internal Medicine

## 2017-09-03 DIAGNOSIS — Z0289 Encounter for other administrative examinations: Secondary | ICD-10-CM

## 2017-09-03 NOTE — Telephone Encounter (Signed)
Pt was last seen 03-20-17 for Mood. She was to follow-up with Dr Alphonsus SiasLetvak on 04-09-17. Since her 03-20-17 OV, she has cancelled 4 times and No Showed 2 times (including today).  We tried reaching out to her February 14, 20, and 27 by phone with no response. I sent her a MyChart message on 08-06-17. I sent her a letter on 08-13-17.  Dr Alphonsus SiasLetvak says he will not refill her alprazolam until she makes an office visit.

## 2017-09-03 NOTE — Telephone Encounter (Signed)
I did not call because she was set up with another appt today. She no showed, again!

## 2017-09-26 ENCOUNTER — Telehealth: Payer: Self-pay | Admitting: Internal Medicine

## 2017-09-26 MED ORDER — ALPRAZOLAM 1 MG PO TABS: 1.0000 mg | ORAL_TABLET | Freq: Two times a day (BID) | ORAL | 0 refills | Status: DC | PRN

## 2017-09-26 NOTE — Telephone Encounter (Signed)
LOV:03/20/17  Next OV: 10/07/17  Dr. Olena MaterLetvak  [facility] [address] [place]

## 2017-09-26 NOTE — Telephone Encounter (Signed)
Okay to send #20 x 0 Let her know I will have to consider dismissing her from the practice if she no shows again

## 2017-09-26 NOTE — Telephone Encounter (Signed)
Copied from CRM [id number]. Topic: Quick Communication - Rx Refill/Question >> Sep 26, 2017 10:55 AM Raquel SarnaHayes, Teresa G wrote: ALPRAZolam [name](XANAX) 1 MG tablet  Pt needing refill on 15th - Pt has an appt w/ Dr. Alphonsus SiasLetvak on Tues 10-07-17.  [facility] [address] - [place], [place] - [address] [place] [id number] Phone: [phone] Fax: [phone]

## 2017-09-26 NOTE — Telephone Encounter (Signed)
Left refill on voice mail at pharmacy Spoke to pt and she understands that if she No Shows she will probably be dismissed from the practice.

## 2017-10-07 ENCOUNTER — Encounter: Payer: Self-pay | Admitting: Internal Medicine

## 2017-10-07 ENCOUNTER — Ambulatory Visit: Payer: Self-pay | Admitting: Internal Medicine

## 2017-10-07 VITALS — BP 102/70 | HR 78 | Temp 98.3°F | Ht 63.0 in | Wt 105.0 lb

## 2017-10-07 DIAGNOSIS — F321 Major depressive disorder, single episode, moderate: Secondary | ICD-10-CM

## 2017-10-07 MED ORDER — ALPRAZOLAM 1 MG PO TABS: 1.0000 mg | ORAL_TABLET | Freq: Two times a day (BID) | ORAL | 0 refills | Status: DC | PRN

## 2017-10-07 MED ORDER — FLUOXETINE HCL 20 MG PO CAPS: 20.0000 mg | ORAL_CAPSULE | Freq: Every day | ORAL | 3 refills | Status: DC

## 2017-10-07 NOTE — Assessment & Plan Note (Signed)
Ongoing MDD Discussed compliance and letting me know what is happening (like when she didn't do well with the zoloft) Needs med that she can afford---best response in the past to prozac. Will try this again--- 20mg  Early follow up
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
## 2017-10-17 ENCOUNTER — Ambulatory Visit: Payer: Self-pay | Admitting: *Deleted

## 2017-10-17 ENCOUNTER — Other Ambulatory Visit: Payer: Self-pay | Admitting: Family Medicine

## 2017-10-17 DIAGNOSIS — F321 Major depressive disorder, single episode, moderate: Secondary | ICD-10-CM

## 2017-10-17 MED ORDER — FLUOXETINE HCL 10 MG PO CAPS: 10.0000 mg | ORAL_CAPSULE | Freq: Every day | ORAL | 0 refills | Status: DC

## 2017-10-17 NOTE — Telephone Encounter (Signed)
She was instructed to notify office if no improvement in symptoms.

## 2017-10-17 NOTE — Telephone Encounter (Signed)
Called and spoke with patient who reports that she started Prozac 20 mg about 10 days ago, takes in the morning. Is willing try 10 mg of Prozac to see if she tolerates well. Has follow up with Dr. Alphonsus Sias scheduled for later this month.

## 2017-10-17 NOTE — Telephone Encounter (Signed)
Prozac  Medication issues  Reason for Disposition . Caller has URGENT medication question about med that PCP prescribed and triager unable to answer question  Answer Assessment - Initial Assessment Questions 1. SYMPTOMS: "Do you have any symptoms?"     Yes hands shaky feeling  anxious since starting Prozac April 23  Had a  panic attack 3 days after starting med on a new job.   2. SEVERITY: If symptoms are present, ask "Are they mild, moderate or severe?"     Getting  Worse this week   Symptoms feel severe to her. Patient reports took med this am . Pt states Dr Alphonsus Sias advised her to call if had any issues with med.    [phone] [id number]  Protocols used: MEDICATION QUESTION CALL-A-AH

## 2017-10-17 NOTE — Telephone Encounter (Signed)
Please Advise

## 2017-10-17 NOTE — Telephone Encounter (Addendum)
Pt said pt has been taking prozac 20 mg once daily for 2 weeks and anxiety is getting worse. Pt feels so nervous that her insides are jittery and hands shake. Pt having difficulty with concentration. Pt also taking xanax 1 mg bid. Right now pt feels like "nervous cat". Pt last seen 10/07/17. Pt wants to know what to do; pt request [facility] [address] Dr Alphonsus Sias out of office and off computer until 10/23/17.

## 2017-11-06 ENCOUNTER — Other Ambulatory Visit: Payer: Self-pay | Admitting: Internal Medicine

## 2017-11-06 NOTE — Telephone Encounter (Signed)
Last filled 10-07-17 #60 Last OV 10-07-17 Next OV 11-07-17

## 2017-11-07 ENCOUNTER — Ambulatory Visit: Payer: Self-pay | Admitting: Internal Medicine

## 2017-11-26 ENCOUNTER — Ambulatory Visit: Payer: Self-pay | Admitting: Internal Medicine

## 2017-12-05 ENCOUNTER — Other Ambulatory Visit: Payer: Self-pay | Admitting: Internal Medicine

## 2017-12-05 NOTE — Telephone Encounter (Signed)
Let her know I only sent #30 till she is back in the office to review the new medication, etc

## 2017-12-05 NOTE — Telephone Encounter (Signed)
Last filled 11-06-17 #60 Last OV 10-07-17 Next OV 12-15-17 (Cancel/No Show x2 since 10-07-17)

## 2017-12-05 NOTE — Telephone Encounter (Signed)
Spoke to pt. She said she understands. 

## 2017-12-15 ENCOUNTER — Ambulatory Visit: Payer: Self-pay | Admitting: Internal Medicine

## 2017-12-15 ENCOUNTER — Encounter: Payer: Self-pay | Admitting: Internal Medicine

## 2017-12-15 VITALS — BP 102/76 | HR 44 | Temp 98.7°F | Ht 63.0 in | Wt 109.0 lb

## 2017-12-15 DIAGNOSIS — F321 Major depressive disorder, single episode, moderate: Secondary | ICD-10-CM

## 2017-12-15 NOTE — Progress Notes (Signed)
Subjective:    Patient ID: Alexis Pierce, female    DOB: October 10, 1970, 47 y.o.   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 ALPRAZolam (XANAX) 1 MG tablet TAKE 1 TABLET BY MOUTH TWICE DAILY AS NEEDED 30 tablet 0  . FLUoxetine (PROZAC) 20 MG capsule Take 1 capsule (20 mg total) by mouth daily. 90 capsule 3  . naphazoline (NAPHCON) 0.1 % ophthalmic solution Place 2 drops into both eyes 4 (four) times daily as needed for irritation. 15 mL 3   No current facility-administered medications on file prior to visit.     Allergies  Allergen Reactions  . Ibuprofen Nausea And Vomiting    Higher Doses 600mg  or more  . Penicillins     Shaking, vomiting  . Hydrocodone-Acetaminophen     REACTION: nausea  . Oxycodone-Acetaminophen     REACTION: nausea    Past Medical History:  Diagnosis Date  . Anxiety 1990  . Depression 2004  . Insomnia   . Suicidal intent 2004   pills  . Syncope and collapse   . VD (venereal disease)    x 2    Past Surgical History:  Procedure Laterality Date  . ABDOMINAL HYSTERECTOMY  2005   endometriosis    Family History  Problem Relation Age of Onset  . Hypertension Mother   . Stroke Mother        x 2  . Obesity Mother   . Cancer Father    bladder, throat, and prostate  . Cancer Maternal Grandmother        lung    Social History   Socioeconomic History  . Marital status: Married    Spouse name: Not on file  . Number of children: 2  . Years of education: Not on file  . Highest education level: Not on file  Occupational History  . Occupation: Not working currently    Comment:    Social Needs  . Financial resource strain: Not on file  . Food insecurity:    Worry: Not on file    Inability: Not on file  . Transportation needs:    Medical: Not on file    Non-medical: Not on file  Tobacco Use  . Smoking status: Never Smoker  . Smokeless tobacco: Never Used  Substance and Sexual Activity  . Alcohol use: No    Alcohol/week: 0.0 oz  . Drug use: No  . Sexual activity: Not on file  Lifestyle  . Physical activity:    Days per week: Not on file    Minutes per session: Not on file  . Stress: Not on file  Relationships  . Social connections:    Talks on phone: Not on file    Gets together:  Not on file    Attends religious service: Not on file    Active member of club or organization: Not on file    Attends meetings of clubs or organizations: Not on file    Relationship status: Not on file  . Intimate partner violence:    Fear of current or ex partner: Not on file    Emotionally abused: Not on file    Physically abused: Not on file    Forced sexual activity: Not on file  Other Topics Concern  . Not on file  Social History Narrative   Patient signed designated party release form and gives Alexis Pierce (spouse) [phone] access to medical records.   Review of Systems Not sleeping well Is eating---"forcing myself"----appetite is some better on the prozac    Objective:   Physical Exam  Constitutional: She appears well-developed. No distress.  Psychiatric:  Less depressed and more talkative at first Some tearfulness Appropriate affect to mood           Assessment & Plan:

## 2017-12-15 NOTE — Patient Instructions (Signed)
Please decrease the fluoxetine to 20mg  every other day. If your anger and irritability are better, just let me know and we will continue it. If the anger, etc are not better---we will stop the fluoxetine and substitute duloxetine (cymbalta). You need to let me know.

## 2017-12-15 NOTE — Assessment & Plan Note (Signed)
Less depression now but irritable as side effect of the fluoxetine Will try cutting to 20mg  every other day If still too angry and irritable, will try changing to duloxetine (30-60mg ) Will try to change by phone

## 2017-12-17 ENCOUNTER — Other Ambulatory Visit: Payer: Self-pay | Admitting: Internal Medicine

## 2017-12-17 NOTE — Telephone Encounter (Signed)
CB: [phone]    Pt called in to follow up on request. I advised pt of response from provider, pt  says that in office visit she was told by PCP that he would send in a Rx for medication then due to pt having to take more.  Pt do understand that Rx will not be available until next week but was just hoping to receive sooner.

## 2017-12-17 NOTE — Telephone Encounter (Signed)
The #30 should last 15 days---so not due till next week

## 2017-12-17 NOTE — Telephone Encounter (Signed)
Last filled 12-10-17 #30 (1/2 supply until OV) Last OV 12-15-17 Next OV 03-18-18  Rx corrected for #60 since she kept appt.

## 2017-12-22 NOTE — Telephone Encounter (Signed)
Pt is calling in to follow up on request. I did advise last week that pt is not due for refill. Pt says that she has been out for days and would just like a call back to know she can expect her refill.   Please advise/assist pt further.    CB: [phone]

## 2017-12-22 NOTE — Addendum Note (Signed)
Addended by: Desmond DikeKNIGHT, Brienna Bass H on: 12/22/2017 01:59 PM   Modules accepted: Orders

## 2017-12-23 ENCOUNTER — Encounter: Payer: Self-pay | Admitting: Internal Medicine

## 2017-12-23 MED ORDER — ALPRAZOLAM 1 MG PO TABS: 1.0000 mg | ORAL_TABLET | Freq: Two times a day (BID) | ORAL | 0 refills | Status: DC | PRN

## 2017-12-23 NOTE — Addendum Note (Signed)
Addended by: Tillman AbideLETVAK, Jeanpaul Biehl I on: 12/23/2017 08:05 AM   Modules accepted: Orders

## 2018-01-06 ENCOUNTER — Emergency Department: Payer: Self-pay

## 2018-01-06 ENCOUNTER — Emergency Department
Admission: EM | Admit: 2018-01-06 | Discharge: 2018-01-06 | Disposition: A | Discharge status: Home / Self Care | Payer: Self-pay | Attending: Emergency Medicine | Admitting: Emergency Medicine

## 2018-01-06 ENCOUNTER — Encounter: Payer: Self-pay | Admitting: Emergency Medicine

## 2018-01-06 ENCOUNTER — Other Ambulatory Visit: Payer: Self-pay

## 2018-01-06 DIAGNOSIS — J189 Pneumonia, unspecified organism: Secondary | ICD-10-CM | POA: Insufficient documentation

## 2018-01-06 DIAGNOSIS — R197 Diarrhea, unspecified: Secondary | ICD-10-CM | POA: Insufficient documentation

## 2018-01-06 DIAGNOSIS — R1012 Left upper quadrant pain: Secondary | ICD-10-CM | POA: Insufficient documentation

## 2018-01-06 DIAGNOSIS — Z79899 Other long term (current) drug therapy: Secondary | ICD-10-CM | POA: Insufficient documentation

## 2018-01-06 DIAGNOSIS — R112 Nausea with vomiting, unspecified: Secondary | ICD-10-CM | POA: Insufficient documentation

## 2018-01-06 LAB — URINALYSIS, COMPLETE (UACMP) WITH MICROSCOPIC
BACTERIA UA: NONE SEEN
BILIRUBIN URINE: NEGATIVE
GLUCOSE, UA: NEGATIVE mg/dL
Ketones, ur: NEGATIVE mg/dL
LEUKOCYTES UA: NEGATIVE
NITRITE: NEGATIVE
PH: 5 (ref 5.0–8.0)
Protein, ur: 100 mg/dL — AB
RBC / HPF: >50 RBC/hpf — ABNORMAL HIGH (ref 0–5)
SPECIFIC GRAVITY, URINE: 1.019 (ref 1.005–1.030)

## 2018-01-06 LAB — LIPASE, BLOOD: Lipase: 26 U/L (ref 11–51)

## 2018-01-06 LAB — CBC
HCT: 30.4 % — ABNORMAL LOW (ref 35.0–47.0)
Hemoglobin: 10.8 g/dL — ABNORMAL LOW (ref 12.0–16.0)
MCH: 31 pg (ref 26.0–34.0)
MCHC: 35.6 g/dL (ref 32.0–36.0)
MCV: 87.2 fL (ref 80.0–100.0)
Platelets: 394 10*3/uL (ref 150–440)
RBC: 3.49 MIL/uL — AB (ref 3.80–5.20)
RDW: 13.3 % (ref 11.5–14.5)
WBC: 7.6 10*3/uL (ref 3.6–11.0)

## 2018-01-06 LAB — BASIC METABOLIC PANEL
Anion gap: 7 (ref 5–15)
BUN: 7 mg/dL (ref 6–20)
CALCIUM: 8.8 mg/dL — AB (ref 8.9–10.3)
CO2: 28 mmol/L (ref 22–32)
Chloride: 102 mmol/L (ref 98–111)
Creatinine, Ser: 0.94 mg/dL (ref 0.44–1.00)
GLUCOSE: 107 mg/dL — AB (ref 70–99)
Potassium: 3.2 mmol/L — ABNORMAL LOW (ref 3.5–5.1)
Sodium: 137 mmol/L (ref 135–145)

## 2018-01-06 LAB — TROPONIN I: Troponin I: <0.03 ng/mL

## 2018-01-06 MED ORDER — SODIUM CHLORIDE 0.9 % IV SOLN
1.0000 g | Freq: Once | INTRAVENOUS | Status: AC
  Administered 2018-01-06: 1 g via INTRAVENOUS
  Filled 2018-01-06: qty 10

## 2018-01-06 MED ORDER — SODIUM CHLORIDE 0.9 % IV SOLN
500.0000 mg | Freq: Once | INTRAVENOUS | Status: AC
  Administered 2018-01-06: 500 mg via INTRAVENOUS
  Filled 2018-01-06: qty 500

## 2018-01-06 MED ORDER — LEVOFLOXACIN 750 MG PO TABS: 750.0000 mg | ORAL_TABLET | Freq: Every day | ORAL | 0 refills | Status: AC

## 2018-01-06 MED ORDER — SODIUM CHLORIDE 0.9 % IV SOLN
1000.0000 mL | Freq: Once | INTRAVENOUS | Status: AC
  Administered 2018-01-06: 1000 mL via INTRAVENOUS

## 2018-01-06 MED ORDER — ONDANSETRON HCL 4 MG/2ML IJ SOLN
4.0000 mg | Freq: Once | INTRAMUSCULAR | Status: AC
  Administered 2018-01-06: 4 mg via INTRAVENOUS
  Filled 2018-01-06: qty 2

## 2018-01-06 MED ORDER — KETOROLAC TROMETHAMINE 30 MG/ML IJ SOLN
30.0000 mg | Freq: Once | INTRAMUSCULAR | Status: AC
  Administered 2018-01-06: 30 mg via INTRAVENOUS
  Filled 2018-01-06: qty 1

## 2018-01-06 NOTE — ED Triage Notes (Signed)
Says she has felt bad for a whole week with extremity weakness, chest pain, palpitations, some fever at home.  No resp symptoms, no urinary problems.  Says she has not been eating as usual.   Has been vomiting and has some stomach  Pain in left upper quadrant.

## 2018-01-06 NOTE — ED Provider Notes (Signed)
[facility] Emergency Department Provider Note   ____________________________________________    I have reviewed the triage vital signs and the nursing notes.   HISTORY  Chief Complaint Weakness and Chest Pain     HPI Alexis Pierce is a 47 y.o. female who presents with complaints of not feeling well over the last week.  She has had intermittent mild cough, chills, fatigue, some nausea as well.  She has not taken anything for this.  She describes some discomfort in her chest which is intermittent.  No recent travel.  No calf pain or swelling.  No abdominal pain, some urinary frequency but no dysuria   Past Medical History:  Diagnosis Date  . Anxiety 1990  . Depression 2004  . Insomnia   . Suicidal intent 2004   pills  . Syncope and collapse   . VD (venereal disease)    x 2    Patient Active Problem List   Diagnosis Date Noted  . MDD (major depressive disorder), single episode, moderate (HCC) 03/20/2017  . Acute non-recurrent frontal sinusitis 07/25/2016  . Preventative health care 01/18/2016  . Sensory loss 01/18/2016  . Right foot sprain 01/18/2016  . Allergic conjunctivitis 11/01/2015  . CONTACT DERMATITIS 07/06/2008  . Anxiety 12/04/2006  . Mood disorder (HCC) 12/04/2006    Past Surgical History:  Procedure Laterality Date  . ABDOMINAL HYSTERECTOMY  2005   endometriosis    Prior to Admission medications   Medication Sig Start Date End Date Taking? Authorizing Provider  ALPRAZolam [name](XANAX) 1 MG tablet Take 1 tablet (1 mg total) by mouth 2 (two) times daily as needed. 12/23/17   Karie SchwalbeLetvak, Richard I, MD  FLUoxetine (PROZAC) 20 MG capsule Take 1 capsule (20 mg total) by mouth daily. 10/07/17   Karie SchwalbeLetvak, Richard I, MD  levofloxacin (LEVAQUIN) 750 MG tablet Take 1 tablet (750 mg total) by mouth daily for 7 days. 01/06/18 01/13/18  Jene EveryKinner, Ayushi Pla, MD  naphazoline (NAPHCON) 0.1 % ophthalmic solution Place 2 drops into both eyes 4 (four) times  daily as needed for irritation. 11/01/15   Karie SchwalbeLetvak, Richard I, MD     Allergies Ibuprofen; Penicillins; Hydrocodone-acetaminophen; and Oxycodone-acetaminophen  Family History  Problem Relation Age of Onset  . Hypertension Mother   . Stroke Mother        x 2  . Obesity Mother   . Cancer Father        bladder, throat, and prostate  . Cancer Maternal Grandmother        lung    Social History Social History   Tobacco Use  . Smoking status: Never Smoker  . Smokeless tobacco: Never Used  Substance Use Topics  . Alcohol use: No    Alcohol/week: 0.0 oz  . Drug use: No    Review of Systems  Constitutional: No fever/chills Eyes: No visual changes.  ENT: No sore throat. Cardiovascular: Denies chest pain. Respiratory: Denies shortness of breath. Gastrointestinal: As above.   Genitourinary: As above Musculoskeletal: Negative for back pain. Skin: Negative for rash. Neurological: Negative for headaches    ____________________________________________   PHYSICAL EXAM:  VITAL SIGNS: ED Triage Vitals [01/06/18 0945]  Enc Vitals Group     BP 109/65     Pulse Rate 94     Resp 16     Temp 99.5 F (37.5 C)     Temp Source Oral     SpO2 100 %     Weight 44.9 kg (99 lb)  Height 1.6 m (5\' 3" )     Head Circumference      Peak Flow      Pain Score 10     Pain Loc      Pain Edu?      Excl. in GC?     Constitutional: Alert and oriented. No acute distress.  Eyes: Conjunctivae are normal.   Nose: No congestion/rhinnorhea. Mouth/Throat: Mucous membranes are moist.    Cardiovascular: Normal rate, regular rhythm. Grossly normal heart sounds.  Good peripheral circulation. Respiratory: Normal respiratory effort.  No retractions.  Gastrointestinal: Soft and nontender. No distention.  No CVA tenderness.  Musculoskeletal: .  Warm and well perfused Neurologic:  Normal speech and language. No gross focal neurologic deficits are appreciated.  Skin:  Skin is warm, dry and  intact. No rash noted. Psychiatric: Mood and affect are normal. Speech and behavior are normal.  ____________________________________________   LABS (all labs ordered are listed, but only abnormal results are displayed)  Labs Reviewed  BASIC METABOLIC PANEL - Abnormal; Notable for the following components:      Result Value   Potassium 3.2 (*)    Glucose, Bld 107 (*)    Calcium 8.8 (*)    All other components within normal limits  CBC - Abnormal; Notable for the following components:   RBC 3.49 (*)    Hemoglobin 10.8 (*)    HCT 30.4 (*)    All other components within normal limits  URINALYSIS, COMPLETE (UACMP) WITH MICROSCOPIC - Abnormal; Notable for the following components:   Color, Urine YELLOW (*)    APPearance HAZY (*)    Hgb urine dipstick LARGE (*)    Protein, ur 100 (*)    RBC / HPF >50 (*)    All other components within normal limits  TROPONIN I  LIPASE, BLOOD   ____________________________________________  EKG  ED ECG REPORT I, Jene Every, the attending physician, personally viewed and interpreted this ECG.  Date: 01/06/2018  Rhythm: normal sinus rhythm QRS Axis: normal Intervals: normal ST/T Wave abnormalities: normal Narrative Interpretation: no evidence of acute ischemia  ____________________________________________  RADIOLOGY  Chest x-ray demonstrates probable bronchitic changes CT renal stone study demonstrates bilateral lower lobe airspace disease ____________________________________________   PROCEDURES  Procedure(s) performed: No  Procedures   Critical Care performed: No ____________________________________________   INITIAL IMPRESSION / ASSESSMENT AND PLAN / ED COURSE  Pertinent labs & imaging results that were available during my care of the patient were reviewed by me and considered in my medical decision making (see chart for details).  Patient presents with multiple symptoms as described above, suspicious for urinary  tract infection, chest x-ray is normal.  Lab work is overall reassuring.  Does have some flank pain as well will send for CT renal stone study to evaluate for possible kidney stone  CT scan negative for kidney stones over lower lobe pneumonia noted.  Patient given IV Rocephin and IV azithromycin in the emergency department, however she is overall well-appearing afebrile with reassuring vitals.  Will treat with p.o. Levaquin, strict return precautions discussed with the patient    ____________________________________________   FINAL CLINICAL IMPRESSION(S) / ED DIAGNOSES  Final diagnoses:  Community acquired pneumonia, unspecified laterality        Note:  This document was prepared using Dragon voice recognition software and may include unintentional dictation errors.    Jene Every, MD 01/06/18 [phone]

## 2018-01-07 ENCOUNTER — Telehealth: Payer: Self-pay

## 2018-01-07 DIAGNOSIS — J189 Pneumonia, unspecified organism: Secondary | ICD-10-CM

## 2018-01-07 DIAGNOSIS — R110 Nausea: Secondary | ICD-10-CM

## 2018-01-07 MED ORDER — ONDANSETRON 4 MG PO TBDP: 4.0000 mg | ORAL_TABLET | Freq: Three times a day (TID) | ORAL | 0 refills | Status: DC | PRN

## 2018-01-07 NOTE — Telephone Encounter (Signed)
Spoken and notified patient of Kate Clark's comments. Patient verbalized understanding.  

## 2018-01-07 NOTE — Telephone Encounter (Signed)
Noted, will send in short term supply of Zofran. Will need PCP follow up once he returns. Melt 1 tablet in the mouth every 8 hours as needed for nausea.

## 2018-01-07 NOTE — Telephone Encounter (Signed)
I called patient to follow up from ER visit yesterday.  Patient dx with pneumonia, she is very nauseated and unable to keep anything on her stomach.  She is very weak and asks [facility] [address]  I will forward this to a covering provider as Dr. Alphonsus SiasLetvak is out of the office.  We will need to let patient know what we call in.  Thanks.

## 2018-01-15 ENCOUNTER — Ambulatory Visit: Payer: Self-pay | Admitting: Internal Medicine

## 2018-01-15 ENCOUNTER — Encounter: Payer: Self-pay | Admitting: Internal Medicine

## 2018-01-15 VITALS — BP 106/70 | HR 69 | Temp 98.1°F | Ht 63.0 in | Wt 105.0 lb

## 2018-01-15 DIAGNOSIS — J189 Pneumonia, unspecified organism: Secondary | ICD-10-CM

## 2018-01-15 DIAGNOSIS — R197 Diarrhea, unspecified: Secondary | ICD-10-CM

## 2018-01-15 DIAGNOSIS — K529 Noninfective gastroenteritis and colitis, unspecified: Secondary | ICD-10-CM

## 2018-01-15 NOTE — Progress Notes (Signed)
Subjective:    Patient ID: Alexis Pierce, female    DOB: [birth year], 47 y.o.   MRN: [id number]  HPI Here for ER follow up   Only just started feeling better ~4 days ago Did a little more activity and then felt dizzy and had to rest Still limited energy since then Some vertigo and "bumping into walls"  Had gone to ER due to bilateral flank pain Never really had cough Stomach trouble as well  Reviewed labs, radiologic studies and MD evaluation Got rocephin and azithromycin Finished levaquin 2 days ago Fever resolved No sig SOB---but gets some palpitations (with dyspnea) ----better with rest  Was having diarrhea before the ER and this continues Watery stools--- as much as 8-10 per day Has been able to eat--- concentrating on protein  Current Outpatient Medications on File Prior to Visit  Medication Sig Dispense Refill  . ALPRAZolam (XANAX) 1 MG tablet Take 1 tablet (1 mg total) by mouth 2 (two) times daily as needed. 60 tablet 0  . FLUoxetine (PROZAC) 20 MG capsule Take 1 capsule (20 mg total) by mouth daily. 90 capsule 3  . naphazoline (NAPHCON) 0.1 % ophthalmic solution Place 2 drops into both eyes 4 (four) times daily as needed for irritation. 15 mL 3  . ondansetron (ZOFRAN ODT) 4 MG disintegrating tablet Take 1 tablet (4 mg total) by mouth every 8 (eight) hours as needed for nausea or vomiting. 20 tablet 0   No current facility-administered medications on file prior to visit.     Allergies  Allergen Reactions  . Ibuprofen Nausea And Vomiting    Higher Doses 600mg  or more  . Penicillins     Shaking, vomiting  . Hydrocodone-Acetaminophen     REACTION: nausea  . Oxycodone-Acetaminophen     REACTION: nausea    Past Medical History:  Diagnosis Date  . Anxiety 1990  . Depression 2004  . Insomnia   . Suicidal intent 2004   pills  . Syncope and collapse   . VD (venereal disease)    x 2    Past Surgical History:  Procedure Laterality Date  . ABDOMINAL  HYSTERECTOMY  2005   endometriosis    Family History  Problem Relation Age of Onset  . Hypertension Mother   . Stroke Mother        x 2  . Obesity Mother   . Cancer Father        bladder, throat, and prostate  . Cancer Maternal Grandmother        lung    Social History   Socioeconomic History  . Marital status: Married    Spouse name: Not on file  . Number of children: 2  . Years of education: Not on file  . Highest education level: Not on file  Occupational History  . Occupation: Not working currently    Comment:    Social Needs  . Financial resource strain: Not on file  . Food insecurity:    Worry: Not on file    Inability: Not on file  . Transportation needs:    Medical: Not on file    Non-medical: Not on file  Tobacco Use  . Smoking status: Never Smoker  . Smokeless tobacco: Never Used  Substance and Sexual Activity  . Alcohol use: No    Alcohol/week: 0.0 oz  . Drug use: No  . Sexual activity: Not on file  Lifestyle  . Physical activity:    Days per week: Not on file  Minutes per session: Not on file  . Stress: Not on file  Relationships  . Social connections:    Talks on phone: Not on file    Gets together: Not on file    Attends religious service: Not on file    Active member of club or organization: Not on file    Attends meetings of clubs or organizations: Not on file    Relationship status: Not on file  . Intimate partner violence:    Fear of current or ex partner: Not on file    Emotionally abused: Not on file    Physically abused: Not on file    Forced sexual activity: Not on file  Other Topics Concern  . Not on file  Social History Narrative   Patient signed designated party release form and gives Jamal CollinShane Steffensmeier (spouse) [phone] access to medical records.   Review of Systems Not sleeping well--feels sleepy No URI symptoms No dysuria or hematuria    Objective:   Physical Exam  Constitutional:  Still pale but NAD  HENT:    Mouth/Throat: Oropharynx is clear and moist. No oropharyngeal exudate.  Neck: No thyromegaly present.  Cardiovascular: Normal rate, regular rhythm and normal heart sounds. Exam reveals no gallop.  No murmur heard. Respiratory: Effort normal and breath sounds normal. No respiratory distress. She has no wheezes. She has no rales.  GI: Soft. Bowel sounds are normal. She exhibits no distension. There is no rebound and no guarding.  Mild diffuse tenderness  Musculoskeletal: She exhibits no edema.  Lymphadenopathy:    She has no cervical adenopathy.           Assessment & Plan:

## 2018-01-15 NOTE — Addendum Note (Signed)
Addended by: Alvina ChouWALSH, TERRI J on: 01/15/2018 03:51 PM   Modules accepted: Orders

## 2018-01-15 NOTE — Assessment & Plan Note (Signed)
Preceded antibiotic use but I am worried about C dif now Will check stool studies probiotic

## 2018-01-15 NOTE — Assessment & Plan Note (Signed)
She had gone to ER with mostly GI symptoms Pneumonia only seen on CT No major respiratory symptoms No further Rx

## 2018-01-15 NOTE — Assessment & Plan Note (Signed)
Diarrhea and abdominal pain  With the pneumonia, suggests viral infection (like enterovirus) Now with ongoing symptoms No blood though Able to eat and not dehydrated Discussed probiotic

## 2018-01-16 NOTE — Addendum Note (Signed)
Addended by: Alvina ChouWALSH, TERRI J on: 01/16/2018 08:13 AM   Modules accepted: Orders

## 2018-01-17 LAB — C. DIFFICILE GDH AND TOXIN A/B
GDH ANTIGEN: NOT DETECTED
MICRO NUMBER:: 90916352
SPECIMEN QUALITY:: ADEQUATE
TOXIN A AND B: NOT DETECTED

## 2018-01-19 ENCOUNTER — Other Ambulatory Visit: Payer: Self-pay | Admitting: Internal Medicine

## 2018-01-19 DIAGNOSIS — R110 Nausea: Secondary | ICD-10-CM

## 2018-01-19 DIAGNOSIS — J189 Pneumonia, unspecified organism: Secondary | ICD-10-CM

## 2018-01-19 MED ORDER — ONDANSETRON 4 MG PO TBDP: 4.0000 mg | ORAL_TABLET | Freq: Three times a day (TID) | ORAL | 0 refills | Status: DC | PRN

## 2018-01-19 NOTE — Telephone Encounter (Signed)
zofran refill Last Refill:01/07/18# 20 Last OV: 01/15/18 PCP: Dr Alphonsus SiasLetvak [facility] [address] [place]

## 2018-01-19 NOTE — Telephone Encounter (Signed)
Copied from CRM [id number]. Topic: Quick Communication - Rx Refill/Question >> Jan 19, 2018 10:46 AM Alexander BergeronBarksdale, Harvey B wrote: Medication: ondansetron (ZOFRAN ODT) 4 MG disintegrating tablet [[id number]][[id number]]   Has the patient contacted their pharmacy? Yes.   (Agent: If no, request that the patient contact the pharmacy for the refill.) (Agent: If yes, when and what did the pharmacy advise?)  [facility]  Agent: Please be advised that RX refills may take up to 3 business days. We ask that you follow-up with your pharmacy.

## 2018-01-19 NOTE — Addendum Note (Signed)
Addended by: Tillman AbideLETVAK, Brentin Shin I on: 01/19/2018 05:37 PM   Modules accepted: Orders

## 2018-01-19 NOTE — Telephone Encounter (Signed)
Name of Medication: Zofran ODT 4 mg Name of Pharmacy: [facility] [address] Last Fill or Written Date and Quantity: # 20 on 01/07/18 Last Office Visit and Type: 01/15/18 ER FU pneumonia Next Office Visit and Type: 03/18/18 3 mth FU.

## 2018-01-19 NOTE — Addendum Note (Signed)
Addended by: Patience MuscaISLEY, RENA M on: 01/19/2018 03:15 PM   Modules accepted: Orders

## 2018-01-22 ENCOUNTER — Other Ambulatory Visit: Payer: Self-pay | Admitting: Internal Medicine

## 2018-01-22 NOTE — Telephone Encounter (Signed)
Last filled 12-23-17 #60 Last OV 01-15-18 Next OV 03-18-18

## 2018-02-23 ENCOUNTER — Other Ambulatory Visit: Payer: Self-pay | Admitting: Internal Medicine

## 2018-02-23 NOTE — Telephone Encounter (Signed)
Last filled 01-22-18 #60 Last OV 01-15-18 Next OV 03-18-18

## 2018-03-18 ENCOUNTER — Telehealth: Payer: Self-pay

## 2018-03-18 ENCOUNTER — Ambulatory Visit: Payer: Self-pay | Admitting: Internal Medicine

## 2018-03-18 NOTE — Telephone Encounter (Signed)
I cancelled the appt.

## 2018-03-18 NOTE — Telephone Encounter (Signed)
PLEASE NOTE: All timestamps contained within this report are represented as [place] Standard Time. CONFIDENTIALTY NOTICE: This fax transmission is intended only for the addressee. It contains information that is legally privileged, confidential or otherwise protected from use or disclosure. If you are not the intended recipient, you are strictly prohibited from reviewing, disclosing, copying using or disseminating any of this information or taking any action in reliance on or regarding this information. If you have received this fax in error, please notify us immediately by telephone so that we can arrange for its return to [place]. Phone: [phone], Toll-Free: [phone], Fax: [phone] Page: 1 of 1 Call Id: [id number] [facility] Night - Client Nonclinical Telephone Record [facility] Medical Call Center Client [facility] Night - Client Client Site [facility] [place] - Night Physician Tillman Abide - MD Contact Type Call Who Is Calling Patient / Member / Family / Caregiver Caller Name Jamilyn Pigeon Caller Phone Number [phone] Patient Name Alexis Pierce Patient DOB 1970/12/24 Call Type Message Only Information Provided Reason for Call Request to [facility] Appointment Initial Comment Caller needs to cancel her appt. for 10/2 at 9:00. Additional Comment Call Closed By: Gustavus Messing Transaction Date/Time: 03/18/2018 6:32:27 AM (ET)

## 2018-03-25 ENCOUNTER — Other Ambulatory Visit: Payer: Self-pay | Admitting: Internal Medicine

## 2018-03-25 NOTE — Telephone Encounter (Signed)
Last filled 02-23-18 #60 Last OV 01-15-18 Next OV 04-14-18

## 2018-04-14 ENCOUNTER — Ambulatory Visit: Payer: Self-pay | Admitting: Internal Medicine

## 2018-04-14 ENCOUNTER — Encounter

## 2018-04-14 ENCOUNTER — Encounter: Payer: Self-pay | Admitting: Internal Medicine

## 2018-04-14 VITALS — BP 140/78 | HR 87 | Temp 98.0°F | Ht 63.0 in | Wt 110.0 lb

## 2018-04-14 DIAGNOSIS — F321 Major depressive disorder, single episode, moderate: Secondary | ICD-10-CM
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
## 2018-04-23 ENCOUNTER — Other Ambulatory Visit: Payer: Self-pay | Admitting: Internal Medicine

## 2018-04-23 NOTE — Telephone Encounter (Signed)
Last filled 03-25-18 #60 Last OV 04-14-18 Next OV 05-12-18

## 2018-05-12 ENCOUNTER — Encounter: Payer: Self-pay | Admitting: Internal Medicine

## 2018-05-12 ENCOUNTER — Ambulatory Visit: Payer: Self-pay | Admitting: Internal Medicine

## 2018-05-12 VITALS — BP 120/76 | HR 79 | Temp 97.9°F | Ht 63.0 in | Wt 113.0 lb

## 2018-05-12 DIAGNOSIS — F321 Major depressive disorder, single episode, moderate: Secondary | ICD-10-CM
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
## 2018-05-25 ENCOUNTER — Other Ambulatory Visit: Payer: Self-pay | Admitting: Internal Medicine

## 2018-05-25 NOTE — Telephone Encounter (Signed)
Last filled 04-23-18 #60 Last OV 05-12-18 Next OV 07-14-18 [facility] [address]

## 2018-06-23 ENCOUNTER — Other Ambulatory Visit: Payer: Self-pay | Admitting: Internal Medicine

## 2018-06-23 NOTE — Telephone Encounter (Signed)
Last filled 05-25-18 #60 Last OV 05-12-18 Next OV 07-14-18 [facility] [address]

## 2018-07-14 ENCOUNTER — Ambulatory Visit: Payer: Self-pay | Admitting: Internal Medicine

## 2018-07-24 ENCOUNTER — Ambulatory Visit: Payer: Self-pay | Admitting: Internal Medicine

## 2018-07-24 ENCOUNTER — Other Ambulatory Visit: Payer: Self-pay | Admitting: Internal Medicine

## 2018-07-24 NOTE — Telephone Encounter (Addendum)
Last filled 06-23-18 #60 Last OV 05-12-18 Next OV was scheduled for today, but cancelled  [facility] [address]

## 2018-07-24 NOTE — Telephone Encounter (Signed)
She did for 07-27-18

## 2018-07-24 NOTE — Telephone Encounter (Signed)
Please have her reschedule

## 2018-07-27 ENCOUNTER — Ambulatory Visit: Payer: Self-pay | Admitting: Internal Medicine

## 2018-07-28 ENCOUNTER — Ambulatory Visit: Payer: Self-pay | Admitting: Internal Medicine

## 2018-07-28 DIAGNOSIS — Z0289 Encounter for other administrative examinations: Secondary | ICD-10-CM

## 2018-08-07 ENCOUNTER — Ambulatory Visit: Payer: Self-pay | Admitting: Internal Medicine

## 2018-08-11 ENCOUNTER — Ambulatory Visit: Payer: Self-pay | Admitting: Internal Medicine

## 2018-08-12 ENCOUNTER — Ambulatory Visit: Payer: Self-pay | Admitting: Internal Medicine

## 2018-08-21 ENCOUNTER — Other Ambulatory Visit: Payer: Self-pay | Admitting: Internal Medicine

## 2018-08-21 NOTE — Telephone Encounter (Signed)
Last filled 07-24-18 #60 Last OV 05-12-18 No Future OV [facility] [address]

## 2018-09-04 ENCOUNTER — Ambulatory Visit (INDEPENDENT_AMBULATORY_CARE_PROVIDER_SITE_OTHER): Payer: Self-pay | Admitting: Internal Medicine

## 2018-09-04 ENCOUNTER — Other Ambulatory Visit: Payer: Self-pay

## 2018-09-04 ENCOUNTER — Encounter: Payer: Self-pay | Admitting: Internal Medicine

## 2018-09-04 VITALS — BP 124/76 | HR 89 | Temp 98.1°F | Resp 18 | Ht 63.0 in | Wt 121.0 lb

## 2018-09-04 DIAGNOSIS — K047 Periapical abscess without sinus: Secondary | ICD-10-CM

## 2018-09-04 MED ORDER — CLINDAMYCIN HCL 300 MG PO CAPS: 300.0000 mg | ORAL_CAPSULE | Freq: Three times a day (TID) | ORAL | 1 refills | Status: DC

## 2018-09-04 NOTE — Assessment & Plan Note (Signed)
With some sinus type symptoms as well Will treat with clinda Continue ibuprofen prn

## 2018-09-04 NOTE — Progress Notes (Signed)
Subjective:    Patient ID: Alexis Pierce, female    DOB: [birth year], 48 y.o.   MRN: [id number]  HPI Here due to respiratory symptoms  Started feeling "yucky" about a week ago No energy, etc Some hot flashes as well 4 days ago-- noticed dizziness when moving around Temp was 99.9 then---ibuprofen helps but then it goes up again to 99+  Intermittent cough--mostly dry Tickle in throat Some nasal drainage (PND) but not a lot DOE with activity Has bad tooth---increased pain since mid last week Has frontal and occipital headache  Current Outpatient Medications on File Prior to Visit  Medication Sig Dispense Refill  . ALPRAZolam (XANAX) 1 MG tablet Take 1 tablet by mouth twice daily as needed 60 tablet 0  . naphazoline (NAPHCON) 0.1 % ophthalmic solution Place 2 drops into both eyes 4 (four) times daily as needed for irritation. 15 mL 3  . ondansetron (ZOFRAN ODT) 4 MG disintegrating tablet Take 1 tablet (4 mg total) by mouth every 8 (eight) hours as needed for nausea or vomiting. 20 tablet 0   No current facility-administered medications on file prior to visit.     Allergies  Allergen Reactions  . Ibuprofen Nausea And Vomiting    Higher Doses 600mg  or more  . Penicillins     Shaking, vomiting  . Hydrocodone-Acetaminophen     REACTION: nausea  . Oxycodone-Acetaminophen     REACTION: nausea    Past Medical History:  Diagnosis Date  . Anxiety 1990  . Depression 2004  . Insomnia   . Suicidal intent 2004   pills  . Syncope and collapse   . VD (venereal disease)    x 2    Past Surgical History:  Procedure Laterality Date  . ABDOMINAL HYSTERECTOMY  2005   endometriosis    Family History  Problem Relation Age of Onset  . Hypertension Mother   . Stroke Mother        x 2  . Obesity Mother   . Cancer Father        bladder, throat, and prostate  . Cancer Maternal Grandmother        lung    Social History   Socioeconomic History  . Marital status: Married    Spouse name: Not on file  . Number of children: 2  . Years of education: Not on file  . Highest education level: Not on file  Occupational History  . Occupation: Not working currently    Comment:    Social Needs  . Financial resource strain: Not on file  . Food insecurity:    Worry: Not on file    Inability: Not on file  . Transportation needs:    Medical: Not on file    Non-medical: Not on file  Tobacco Use  . Smoking status: Never Smoker  . Smokeless tobacco: Never Used  Substance and Sexual Activity  . Alcohol use: No    Alcohol/week: 0.0 standard drinks  . Drug use: No  . Sexual activity: Not on file  Lifestyle  . Physical activity:    Days per week: Not on file    Minutes per session: Not on file  . Stress: Not on file  Relationships  . Social connections:    Talks on phone: Not on file    Gets together: Not on file    Attends religious service: Not on file    Active member of club or organization: Not on file    Attends meetings  of clubs or organizations: Not on file    Relationship status: Not on file  . Intimate partner violence:    Fear of current or ex partner: Not on file    Emotionally abused: Not on file    Physically abused: Not on file    Forced sexual activity: Not on file  Other Topics Concern  . Not on file  Social History Narrative   Patient signed designated party release form and gives Jazariah Rosch (spouse) [phone] access to medical records.   Review of Systems Some nausea Appetite is okay---but then gets nauseated No diarrhea She took her wedding ring off and husband "started acting right". He was seen by his doctor and started on medication    Objective:   Physical Exam  Constitutional: She appears well-developed. No distress.  HENT:  Mild maxillary tenderness TMs normal Mild nasal inflammation Pharynx without injection 2 broken teeth lower left--with tenderness  Neck: No thyromegaly present.  Respiratory: Effort normal and  breath sounds normal. No respiratory distress. She has no wheezes. She has no rales.  Lymphadenopathy:    She has no cervical adenopathy.           Assessment & Plan:

## 2018-09-09 ENCOUNTER — Telehealth: Payer: Self-pay

## 2018-09-09 NOTE — Telephone Encounter (Signed)
Pt left v/m; pt was seen on 09/04/18 and pt was given abx for tooth infection.pt take aleve for fever and pain. Pt has been running fever 99.4 - 99.9 for 2 wks. If pt takes aleve no fever until aleve wears off.pt is still achy and not feeling better. Pt wants to know if could get different abx. [facility] [address]

## 2018-09-10 NOTE — Telephone Encounter (Signed)
Spoke to pt. She will refill the clindamycin.

## 2018-09-10 NOTE — Telephone Encounter (Signed)
If this is sinus or tooth, there is no better antibiotic  She may want to refill it and continue for now (if there was abscess, it may take some time)

## 2018-09-23 ENCOUNTER — Other Ambulatory Visit: Payer: Self-pay | Admitting: Internal Medicine

## 2018-09-23 MED ORDER — ALPRAZOLAM 1 MG PO TABS: 1.0000 mg | ORAL_TABLET | Freq: Two times a day (BID) | ORAL | 0 refills | Status: DC | PRN

## 2018-09-23 NOTE — Telephone Encounter (Signed)
We have not received a refill request as of yet from [facility]. No rx was sent to [facility]. Last filled 08-21-18 #60

## 2018-09-23 NOTE — Telephone Encounter (Signed)
Pt called checking on her rx for alprazolam she switched drug stores  Needs to [facility].  [facility]  Can you resend to [employer] number [phone]

## 2018-10-09 ENCOUNTER — Other Ambulatory Visit: Payer: Self-pay | Admitting: Internal Medicine

## 2018-10-09 NOTE — Telephone Encounter (Signed)
Patient called back and she finished the refill for Clindamycin about 2-3 weeks ago.  Patient said her teeth are hurting again and she's running a low grade fever since yesterday.  Patient wants to know if a rx for clindamycin can be sent to [facility]. Patient can't go to a dentist because they're closed.  Dr.Letvak's out of the office this afternoon and patient said she's in pain and would like it called in today.

## 2018-10-09 NOTE — Telephone Encounter (Signed)
Needs to await PCP response or go to Saturday clinic.

## 2018-10-10 MED ORDER — CLINDAMYCIN HCL 300 MG PO CAPS: 300.0000 mg | ORAL_CAPSULE | Freq: Three times a day (TID) | ORAL | 0 refills | Status: DC

## 2018-10-10 NOTE — Addendum Note (Signed)
Addended by: Tillman Abide I on: 10/10/2018 07:59 AM   Modules accepted: Orders

## 2018-10-10 NOTE — Telephone Encounter (Signed)
Refill doe

## 2018-10-22 ENCOUNTER — Other Ambulatory Visit: Payer: Self-pay | Admitting: Internal Medicine

## 2018-10-22 NOTE — Telephone Encounter (Signed)
Last filled 09-23-18 #60 Last OV 09-04-18 Acute No Future OV [facility]

## 2018-11-23 ENCOUNTER — Other Ambulatory Visit: Payer: Self-pay | Admitting: Internal Medicine

## 2018-11-23 NOTE — Telephone Encounter (Signed)
[place] filled 10-22-18 #60 Last OV 09-04-18 No Future OV [facility]

## 2018-12-22 ENCOUNTER — Other Ambulatory Visit: Payer: Self-pay | Admitting: Internal Medicine

## 2018-12-22 NOTE — Telephone Encounter (Signed)
Last filled 11-23-18 #60 Last OV 09-04-18 Acute No Future OV [facility]

## 2018-12-29 ENCOUNTER — Other Ambulatory Visit: Payer: Self-pay

## 2018-12-29 ENCOUNTER — Encounter: Payer: Self-pay | Admitting: Internal Medicine

## 2018-12-29 ENCOUNTER — Ambulatory Visit: Payer: Self-pay | Admitting: Internal Medicine

## 2018-12-29 DIAGNOSIS — R42 Dizziness and giddiness: Secondary | ICD-10-CM

## 2018-12-29 MED ORDER — MECLIZINE HCL 25 MG PO TABS: 25.0000 mg | ORAL_TABLET | Freq: Three times a day (TID) | ORAL | 3 refills | Status: DC | PRN

## 2018-12-29 NOTE — Progress Notes (Signed)
Subjective:    Patient ID: Alexis Pierce, female    DOB: 07/24/1970, 48 y.o.   MRN: [id number]  HPI Here due to dizziness  "my head is driving me crazy" Episode for a day about 5 weeks ago Then woke up 5 days ago with same symptoms--hasn't gone away  First spell--hopped out of bed and the room was spinning and she saw spots in her eyes Got nauseated Had to stay in bed that day--then awoke and felt normal the next day  Awoke this time with flashes of light in her eyes and the spinning again Feels like her head is "all stopped up" Okay if she looks straight forward--she is okay If she looks in any other direction--she gets dizzy and nauseated Has affected her thinking Also had tingling in right arm and leg that one day--then it went away after awakening 4 days ago  Dramamine helps--but wears off before even the 4 hours Also taking ibuprofen 800mg --helps headache for a while then it comes back Only caffeine is 2 cups of coffee in the morning  Current Outpatient Medications on File Prior to Visit  Medication Sig Dispense Refill  . ALPRAZolam (XANAX) 1 MG tablet TAKE 1 TABLET (1 MG TOTAL) BY MOUTH 2 (TWO) TIMES DAILY AS NEEDED. 60 tablet 0  . naphazoline (NAPHCON) 0.1 % ophthalmic solution Place 2 drops into both eyes 4 (four) times daily as needed for irritation. 15 mL 3  . ondansetron (ZOFRAN ODT) 4 MG disintegrating tablet Take 1 tablet (4 mg total) by mouth every 8 (eight) hours as needed for nausea or vomiting. 20 tablet 0   No current facility-administered medications on file prior to visit.     Allergies  Allergen Reactions  . Ibuprofen Nausea And Vomiting    Higher Doses 600mg  or more  . Penicillins     Shaking, vomiting  . Hydrocodone-Acetaminophen     REACTION: nausea  . Oxycodone-Acetaminophen     REACTION: nausea    Past Medical History:  Diagnosis Date  . Anxiety 1990  . Depression 2004  . Insomnia   . Suicidal intent 2004   pills  . Syncope and  collapse   . VD (venereal disease)    x 2    Past Surgical History:  Procedure Laterality Date  . ABDOMINAL HYSTERECTOMY  2005   endometriosis    Family History  Problem Relation Age of Onset  . Hypertension Mother   . Stroke Mother        x 2  . Obesity Mother   . Cancer Father        bladder, throat, and prostate  . Cancer Maternal Grandmother        lung    Social History   Socioeconomic History  . Marital status: Married    Spouse name: Not on file  . Number of children: 2  . Years of education: Not on file  . Highest education level: Not on file  Occupational History  . Occupation: Not working currently    Comment:    Social Needs  . Financial resource strain: Not on file  . Food insecurity    Worry: Not on file    Inability: Not on file  . Transportation needs    Medical: Not on file    Non-medical: Not on file  Tobacco Use  . Smoking status: Never Smoker  . Smokeless tobacco: Never Used  Substance and Sexual Activity  . Alcohol use: No    Alcohol/week:  0.0 standard drinks  . Drug use: No  . Sexual activity: Not on file  Lifestyle  . Physical activity    Days per week: Not on file    Minutes per session: Not on file  . Stress: Not on file  Relationships  . Social Musicianconnections    Talks on phone: Not on file    Gets together: Not on file    Attends religious service: Not on file    Active member of club or organization: Not on file    Attends meetings of clubs or organizations: Not on file    Relationship status: Not on file  . Intimate partner violence    Fear of current or ex partner: Not on file    Emotionally abused: Not on file    Physically abused: Not on file    Forced sexual activity: Not on file  Other Topics Concern  . Not on file  Social History Narrative   Patient signed designated party release form and gives Alexis Pierce (spouse) [phone] access to medical records.   Review of Systems No history of migraines Had hysterectomy  and ovaries out years ago No aphasia No facial droop--but she thought one eye was droopy a bit this morning (left) No focal weakness--"but my whole body is tired" Did get one of the lower teeth out in May--needs the other done still Is having some hormonal symptoms though    Objective:   Physical Exam  Constitutional: She is oriented to person, place, and time. She appears well-developed. No distress.  HENT:  Mouth/Throat: Oropharynx is clear and moist. No oropharyngeal exudate.  Eyes: Pupils are equal, round, and reactive to light. Conjunctivae and EOM are normal.  Neck: No thyromegaly present.  Cardiovascular: Normal rate, regular rhythm and normal heart sounds. Exam reveals no gallop.  No murmur heard. Respiratory: Effort normal and breath sounds normal. No respiratory distress. She has no wheezes. She has no rales.  GI: Soft. There is no abdominal tenderness.  Musculoskeletal:        General: No tenderness or edema.  Lymphadenopathy:    She has no cervical adenopathy.  Neurological: She is oriented to person, place, and time. She has normal strength. She displays no tremor. No cranial nerve deficit. She exhibits normal muscle tone. She displays a negative Romberg sign. Coordination and gait normal.           Assessment & Plan:

## 2018-12-29 NOTE — Assessment & Plan Note (Addendum)
Has elements that sound like migraine--but never had problems before Shouldn't be menopausal--ovaries removed many years ago---but has been having hormonal symptoms Nothing to suggest brain stem stroke--but the focal numbness could be cerebrovascular (though possibly part of migraine syndrome)  Will try meclizine If recurrent focal neuro symptoms, will need ER if persistent (or at least MRI as outpatient if symptoms are transient) Consider neurology evaluation if this doesn't resolve fairly quickly

## 2018-12-31 MED ORDER — DIAZEPAM 5 MG PO TABS: 2.5000 mg | ORAL_TABLET | Freq: Three times a day (TID) | ORAL | 0 refills | Status: DC | PRN

## 2019-01-22 ENCOUNTER — Other Ambulatory Visit: Payer: Self-pay | Admitting: Internal Medicine

## 2019-01-22 NOTE — Telephone Encounter (Signed)
Last filled 12-22-18 #60 Last OV 09-04-18 No Future OV [facility]

## 2019-02-17 MED ORDER — DIAZEPAM 5 MG PO TABS: 2.5000 mg | ORAL_TABLET | Freq: Two times a day (BID) | ORAL | 0 refills | Status: DC | PRN

## 2019-03-17 MED ORDER — FLUOXETINE HCL 20 MG PO TABS: 20.0000 mg | ORAL_TABLET | Freq: Every day | ORAL | 3 refills | Status: DC

## 2019-03-17 NOTE — Telephone Encounter (Signed)
Please set up a follow up in 2-3 weeks (virrtual or in person)

## 2019-03-19 ENCOUNTER — Other Ambulatory Visit: Payer: Self-pay | Admitting: Internal Medicine

## 2019-03-19 NOTE — Telephone Encounter (Signed)
Last filled 02-17-19 #60 Last OV 12-29-18 Next OV 04-07-19 [facility]

## 2019-04-07 ENCOUNTER — Ambulatory Visit (INDEPENDENT_AMBULATORY_CARE_PROVIDER_SITE_OTHER): Payer: Self-pay | Admitting: Internal Medicine

## 2019-04-07 ENCOUNTER — Encounter: Payer: Self-pay | Admitting: Internal Medicine

## 2019-04-07 DIAGNOSIS — F321 Major depressive disorder, single episode, moderate: Secondary | ICD-10-CM

## 2019-04-07 NOTE — Assessment & Plan Note (Signed)
Has clearly responded to the fluoxetine but is having some degree of increased anxiety Discussed that this can be a normal side effect and should fade away (though has chronic ongoing anxiety) Discussed the possible need to increase the fluoxetine if her treatment effect wanes Will recheck 1 month

## 2019-04-07 NOTE — Progress Notes (Signed)
Subjective:    Patient ID: Alexis Pierce, female    DOB: 1970/11/08, 48 y.o.   MRN: [id number]  HPI Virtual visit for review of depression and anxeity Identification done Reviewed billing and she gave consent She is at home and I am in my office  Reviewed the MDD diagnosis She had felt "that I was about to have a nervous breakdown" She was "so low" that day "I didn't want to be here anymore":----but no plan  No problems starting the fluoxetine Does feel she can function again "I feel different--it doesn't bother me to be around my kids and grandkds"  Anxiety is still acting up The valium helps more than the xanax did--but using them twice a day and feels like it is wearing off  Current Outpatient Medications on File Prior to Visit  Medication Sig Dispense Refill  . diazepam (VALIUM) 5 MG tablet TAKE 0.5-1 TABLETS (2.5-5 MG TOTAL) BY MOUTH 2 (TWO) TIMES DAILY AS NEEDED FOR ANXIETY. 60 tablet 0  . FLUoxetine (PROZAC) 20 MG tablet Take 1 tablet (20 mg total) by mouth daily. 30 tablet 3  . meclizine (ANTIVERT) 25 MG tablet Take 1-2 tablets (25-50 mg total) by mouth 3 (three) times daily as needed for dizziness. 60 tablet 3  . ondansetron (ZOFRAN ODT) 4 MG disintegrating tablet Take 1 tablet (4 mg total) by mouth every 8 (eight) hours as needed for nausea or vomiting. 20 tablet 0  . naphazoline (NAPHCON) 0.1 % ophthalmic solution Place 2 drops into both eyes 4 (four) times daily as needed for irritation. (Patient not taking: Reported on 04/07/2019) 15 mL 3   No current facility-administered medications on file prior to visit.     Allergies  Allergen Reactions  . Ibuprofen Nausea And Vomiting    Higher Doses 600mg  or more  . Penicillins     Shaking, vomiting  . Hydrocodone-Acetaminophen     REACTION: nausea  . Oxycodone-Acetaminophen     REACTION: nausea    Past Medical History:  Diagnosis Date  . Anxiety 1990  . Depression 2004  . Insomnia   . Suicidal intent 2004   pills  . Syncope and collapse   . VD (venereal disease)    x 2    Past Surgical History:  Procedure Laterality Date  . ABDOMINAL HYSTERECTOMY  2005   endometriosis    Family History  Problem Relation Age of Onset  . Hypertension Mother   . Stroke Mother        x 2  . Obesity Mother   . Cancer Father        bladder, throat, and prostate  . Cancer Maternal Grandmother        lung    Social History   Socioeconomic History  . Marital status: Married    Spouse name: Not on file  . Number of children: 2  . Years of education: Not on file  . Highest education level: Not on file  Occupational History  . Occupation: Not working currently    Comment:    Social Needs  . Financial resource strain: Not on file  . Food insecurity    Worry: Not on file    Inability: Not on file  . Transportation needs    Medical: Not on file    Non-medical: Not on file  Tobacco Use  . Smoking status: Never Smoker  . Smokeless tobacco: Never Used  Substance and Sexual Activity  . Alcohol use: No    Alcohol/week:  0.0 standard drinks  . Drug use: No  . Sexual activity: Not on file  Lifestyle  . Physical activity    Days per week: Not on file    Minutes per session: Not on file  . Stress: Not on file  Relationships  . Social Musician on phone: Not on file    Gets together: Not on file    Attends religious service: Not on file    Active member of club or organization: Not on file    Attends meetings of clubs or organizations: Not on file    Relationship status: Not on file  . Intimate partner violence    Fear of current or ex partner: Not on file    Emotionally abused: Not on file    Physically abused: Not on file    Forced sexual activity: Not on file  Other Topics Concern  . Not on file  Social History Narrative   Patient signed designated party release form and gives Aryella Besecker (spouse) [phone] access to medical records.   Review of Systems Eating a little  better Waking up only 2-3 nights a week now--instead of every night    Objective:   Physical Exam  Constitutional: She appears well-developed. No distress.  Psychiatric:  Calm now No overt depression Affect is upbeat with normal appearance and speech           Assessment & Plan:

## 2019-04-10 ENCOUNTER — Other Ambulatory Visit: Payer: Self-pay | Admitting: Internal Medicine

## 2019-04-12 MED ORDER — FLUOXETINE HCL 20 MG PO TABS: 20.0000 mg | ORAL_TABLET | Freq: Every day | ORAL | 3 refills | Status: DC

## 2019-04-15 ENCOUNTER — Telehealth: Payer: Self-pay | Admitting: Internal Medicine

## 2019-04-15 MED ORDER — FLUOXETINE HCL 20 MG PO TABS: 20.0000 mg | ORAL_TABLET | Freq: Every day | ORAL | 6 refills | Status: DC

## 2019-04-15 NOTE — Telephone Encounter (Signed)
Alexis Pierce,[facility],called.  She's requesting rx for Fluoxetine 20 mg be changed from tablet to capsule.  The capsules are much less expensive.

## 2019-04-16 MED ORDER — FLUOXETINE HCL 20 MG PO CAPS: ORAL_CAPSULE | ORAL | 6 refills | Status: DC

## 2019-04-16 NOTE — Telephone Encounter (Signed)
Made the change

## 2019-04-19 ENCOUNTER — Other Ambulatory Visit: Payer: Self-pay | Admitting: Internal Medicine

## 2019-04-19 NOTE — Telephone Encounter (Signed)
Last filled 03-19-19 #60 Last OV 04-07-19 Next OV 05-05-19 [facility]

## 2019-05-05 ENCOUNTER — Ambulatory Visit: Payer: Self-pay | Admitting: Internal Medicine

## 2019-05-05 ENCOUNTER — Telehealth: Payer: Self-pay

## 2019-05-05 NOTE — Telephone Encounter (Signed)
Per pt appt notes pt had work conflict today and rescheduled appt for 05/10/19. FYI to [facility] CMA.

## 2019-05-05 NOTE — Telephone Encounter (Signed)
[facility] Night - Client Nonclinical Telephone Record AccessNurse Client [facility] Night - Client Client Site [place] Physician Viviana Simpler - MD Contact Type Call Who Is Calling Patient / Member / Family / Caregiver Caller Name Jaquala Fuller Caller Phone Number [phone] Patient Name Lubna Stegeman Patient DOB June 21, 1970 Call Type Message Only Information Provided Reason for Call Request to Reschedule Office Appointment Initial Comment Caller stated she is needing to reschedule an appt for this morning Additional Comment Declined triage Call Closed By: Tharon Aquas Transaction Date/Time: 05/05/2019 7:11:36 AM (ET)

## 2019-05-10 ENCOUNTER — Ambulatory Visit: Payer: Self-pay | Admitting: Internal Medicine

## 2019-05-18 NOTE — Telephone Encounter (Signed)
Please call her and set up a virtual appt

## 2019-05-19 ENCOUNTER — Other Ambulatory Visit: Payer: Self-pay | Admitting: Internal Medicine

## 2019-05-19 NOTE — Telephone Encounter (Signed)
Last filled 04-19-19 #60 Last OV 04-07-19 Next OV 05-21-19 [facility]

## 2019-05-21 ENCOUNTER — Ambulatory Visit (INDEPENDENT_AMBULATORY_CARE_PROVIDER_SITE_OTHER): Payer: Self-pay | Admitting: Internal Medicine

## 2019-05-21 ENCOUNTER — Encounter: Payer: Self-pay | Admitting: Internal Medicine

## 2019-05-21 ENCOUNTER — Other Ambulatory Visit: Payer: Self-pay

## 2019-05-21 DIAGNOSIS — F321 Major depressive disorder, single episode, moderate: Secondary | ICD-10-CM

## 2019-05-21 NOTE — Progress Notes (Signed)
Subjective:    Patient ID: Alexis Pierce, female    DOB: November 26, 1970, 48 y.o.   MRN: [id number]  HPI Video virtual visit for review of worsening depression Identification done Reviewed billing and limitations--she gave consent  She is at park with grandkids and I am in my office---just [place] as participants  Has felt that her depression is worsened in the past couple of weeks No suicidal thoughts Now has to watch grandchildren every day all day Patience is wearing thin Feels anxious all the time again---the prozac had helped this  Daughter left husband--and she is now with her (and granddaughter out of daycare so she is watching her Grandson on virtual schooling--so she is doing that Daughter has kind of given u a lot of her parental duties since back with her  Did increase the fluoxetine to 2 per day---like 2-3 days ago She does notice some difference--but still very anxious  Current Outpatient Medications on File Prior to Visit  Medication Sig Dispense Refill  . diazepam (VALIUM) 5 MG tablet TAKE 0.5-1 TABLETS (2.5-5 MG TOTAL) BY MOUTH 2 (TWO) TIMES DAILY AS NEEDED FOR ANXIETY. 60 tablet 0  . FLUoxetine (PROZAC) 20 MG capsule Take 1 tablet (20 mg total) by mouth daily. On odd days and 2 tablets on even days 90 capsule 6  . meclizine (ANTIVERT) 25 MG tablet Take 1-2 tablets (25-50 mg total) by mouth 3 (three) times daily as needed for dizziness. 60 tablet 3  . naphazoline (NAPHCON) 0.1 % ophthalmic solution Place 2 drops into both eyes 4 (four) times daily as needed for irritation. 15 mL 3  . ondansetron (ZOFRAN ODT) 4 MG disintegrating tablet Take 1 tablet (4 mg total) by mouth every 8 (eight) hours as needed for nausea or vomiting. 20 tablet 0   No current facility-administered medications on file prior to visit.     Allergies  Allergen Reactions  . Ibuprofen Nausea And Vomiting    Higher Doses 600mg  or more  . Penicillins     Shaking, vomiting  . Hydrocodone-Acetaminophen      REACTION: nausea  . Oxycodone-Acetaminophen     REACTION: nausea    Past Medical History:  Diagnosis Date  . Anxiety 1990  . Depression 2004  . Insomnia   . Suicidal intent 2004   pills  . Syncope and collapse   . VD (venereal disease)    x 2    Past Surgical History:  Procedure Laterality Date  . ABDOMINAL HYSTERECTOMY  2005   endometriosis    Family History  Problem Relation Age of Onset  . Hypertension Mother   . Stroke Mother        x 2  . Obesity Mother   . Cancer Father        bladder, throat, and prostate  . Cancer Maternal Grandmother        lung    Social History   Socioeconomic History  . Marital status: Married    Spouse name: Not on file  . Number of children: 2  . Years of education: Not on file  . Highest education level: Not on file  Occupational History  . Occupation: Not working currently    Comment:    Social Needs  . Financial resource strain: Not on file  . Food insecurity    Worry: Not on file    Inability: Not on file  . Transportation needs    Medical: Not on file    Non-medical: Not on  file  Tobacco Use  . Smoking status: Never Smoker  . Smokeless tobacco: Never Used  Substance and Sexual Activity  . Alcohol use: No    Alcohol/week: 0.0 standard drinks  . Drug use: No  . Sexual activity: Not on file  Lifestyle  . Physical activity    Days per week: Not on file    Minutes per session: Not on file  . Stress: Not on file  Relationships  . Social Herbalist on phone: Not on file    Gets together: Not on file    Attends religious service: Not on file    Active member of club or organization: Not on file    Attends meetings of clubs or organizations: Not on file    Relationship status: Not on file  . Intimate partner violence    Fear of current or ex partner: Not on file    Emotionally abused: Not on file    Physically abused: Not on file    Forced sexual activity: Not on file  Other Topics Concern  .  Not on file  Social History Narrative   Patient signed designated party release form and gives Azarya Oconnell (spouse) [phone] access to medical records.   Review of Systems Sleep is still variable--- 4-5 hours per night at best Not as tired now off the xanax Uses the valium bid regularly Appetite is also variable--good some days and not so good others Weight is stable    Objective:   Physical Exam  Constitutional: She appears well-developed. No distress.  Psychiatric:  Seems fairly bright  Not depressed Affect appropriate Normal speech and appearance           Assessment & Plan:

## 2019-05-21 NOTE — Assessment & Plan Note (Signed)
Worsened with situational stress of daughter having to move in with kids Now having every day day care responsibilities as well Discussed defining roles for daughter's responsibilities No suicidal thoughts---over all doing some better now Major anxiety as well---using the diazepam bid  Okay to stay with fluoxetine 40mg  daily for now I asked her to update me by email in 2-3 weeks If doing well, will probably try to wean to 20/40 every other day If worse, may need to add another agent (?bupropion)

## 2019-06-20 ENCOUNTER — Other Ambulatory Visit: Payer: Self-pay | Admitting: Internal Medicine

## 2019-06-21 NOTE — Telephone Encounter (Signed)
Last filled 05-20-19 #60 Last OV 05-21-19 Next OV 08-25-19 [facility]

## 2019-07-20 ENCOUNTER — Other Ambulatory Visit: Payer: Self-pay | Admitting: Internal Medicine

## 2019-07-20 NOTE — Telephone Encounter (Signed)
Last filled 06-21-19 #60 Last OV 05-21-19 Next OV 08-25-19 [facility]

## 2019-08-19 MED ORDER — ALPRAZOLAM 1 MG PO TABS: 0.5000 mg | ORAL_TABLET | Freq: Two times a day (BID) | ORAL | 0 refills | Status: DC | PRN

## 2019-08-25 ENCOUNTER — Encounter: Payer: Self-pay | Admitting: Internal Medicine

## 2019-08-25 ENCOUNTER — Other Ambulatory Visit: Payer: Self-pay

## 2019-08-25 ENCOUNTER — Ambulatory Visit (INDEPENDENT_AMBULATORY_CARE_PROVIDER_SITE_OTHER): Payer: Self-pay | Admitting: Internal Medicine

## 2019-08-25 DIAGNOSIS — F321 Major depressive disorder, single episode, moderate: Secondary | ICD-10-CM

## 2019-08-25 NOTE — Patient Instructions (Signed)
If you feel well in 2 months, you can reduce the fluoxetine to just one 20mg  capsule daily.

## 2019-08-25 NOTE — Progress Notes (Signed)
Subjective:    Patient ID: Alexis Pierce, female    DOB: [birth year], 49 y.o.   MRN: [id number]  HPI  Video virtual visit for follow up of depression Identification done Reviewed billing and limitations and she gave consent. Participants--patient at home and I am in my office  Daughter moved back home last month--with the kids Now she misses them--back alone at home Still spends a lot of time with kids  Anxiety is still an issue We just switched back to the xanax (valium didn't seem to be helping) This has helped Had been on 2 of the fluoxetine daily----now able to decrease to 40/20 every other day since going back on the xanax Had felt the higher dose "made me like a zombie--affecting my head"  Still with depression--but not as bad now No suicidal thought or thoughts of death  Current Outpatient Medications on File Prior to Visit  Medication Sig Dispense Refill  . ALPRAZolam (XANAX) 1 MG tablet Take 0.5-1 tablets (0.5-1 mg total) by mouth 2 (two) times daily as needed for anxiety. 60 tablet 0  . FLUoxetine (PROZAC) 20 MG capsule Take 1 tablet (20 mg total) by mouth daily. On odd days and 2 tablets on even days 90 capsule 6  . meclizine (ANTIVERT) 25 MG tablet Take 1-2 tablets (25-50 mg total) by mouth 3 (three) times daily as needed for dizziness. 60 tablet 3  . Multiple Vitamin (MULTIVITAMIN) tablet Take 1 tablet by mouth daily.    . naphazoline (NAPHCON) 0.1 % ophthalmic solution Place 2 drops into both eyes 4 (four) times daily as needed for irritation. 15 mL 3  . ondansetron (ZOFRAN ODT) 4 MG disintegrating tablet Take 1 tablet (4 mg total) by mouth every 8 (eight) hours as needed for nausea or vomiting. 20 tablet 0   No current facility-administered medications on file prior to visit.    Allergies  Allergen Reactions  . Ibuprofen Nausea And Vomiting    Higher Doses 600mg  or more  . Penicillins     Shaking, vomiting  . Hydrocodone-Acetaminophen     REACTION: nausea    . Oxycodone-Acetaminophen     REACTION: nausea    Past Medical History:  Diagnosis Date  . Anxiety 1990  . Depression 2004  . Insomnia   . Suicidal intent 2004   pills  . Syncope and collapse   . VD (venereal disease)    x 2    Past Surgical History:  Procedure Laterality Date  . ABDOMINAL HYSTERECTOMY  2005   endometriosis    Family History  Problem Relation Age of Onset  . Hypertension Mother   . Stroke Mother        x 2  . Obesity Mother   . Cancer Father        bladder, throat, and prostate  . Cancer Maternal Grandmother        lung    Social History   Socioeconomic History  . Marital status: Married    Spouse name: Not on file  . Number of children: 2  . Years of education: Not on file  . Highest education level: Not on file  Occupational History  . Occupation: Not working currently    Comment:    Tobacco Use  . Smoking status: Never Smoker  . Smokeless tobacco: Never Used  Substance and Sexual Activity  . Alcohol use: No    Alcohol/week: 0.0 standard drinks  . Drug use: No  . Sexual activity: Not on file  Other Topics Concern  . Not on file  Social History Narrative   Patient signed designated party release form and gives Alexis Pierce (spouse) [phone] access to medical records.   Social Determinants of Health   Financial Resource Strain:   . Difficulty of Paying Living Expenses: Not on file  Food Insecurity:   . Worried About Charity fundraiser in the Last Year: Not on file  . Ran Out of Food in the Last Year: Not on file  Transportation Needs:   . Lack of Transportation (Medical): Not on file  . Lack of Transportation (Non-Medical): Not on file  Physical Activity:   . Days of Exercise per Week: Not on file  . Minutes of Exercise per Session: Not on file  Stress:   . Feeling of Stress : Not on file  Social Connections:   . Frequency of Communication with Friends and Family: Not on file  . Frequency of Social Gatherings with Friends  and Family: Not on file  . Attends Religious Services: Not on file  . Active Member of Clubs or Organizations: Not on file  . Attends Archivist Meetings: Not on file  . Marital Status: Not on file  Intimate Partner Violence:   . Fear of Current or Ex-Partner: Not on file  . Emotionally Abused: Not on file  . Physically Abused: Not on file  . Sexually Abused: Not on file   Review of Systems Eating well--has gained some weight Sleeping better since back on xanax    Objective:   Physical Exam  Constitutional: She appears well-developed. No distress.  Psychiatric:  More upbeat Doesn't appear anxious           Assessment & Plan:

## 2019-08-25 NOTE — Assessment & Plan Note (Signed)
This seems better Has anxiety which is mixed in chronically--which seems better since the switch back to alprazolam. Continue fluoxetine 40/20 every other day for 2 months--and if doing well, can cut back to 20mg  daily

## 2019-09-13 NOTE — Telephone Encounter (Signed)
Pt had appt 05/21/19.

## 2019-09-17 ENCOUNTER — Other Ambulatory Visit: Payer: Self-pay | Admitting: Internal Medicine

## 2019-09-20 NOTE — Telephone Encounter (Signed)
Last filled 08-19-19 #60 Last OV 08-25-19 Next OV 02-23-20 [facility]

## 2019-10-20 ENCOUNTER — Other Ambulatory Visit: Payer: Self-pay | Admitting: Internal Medicine

## 2019-10-20 NOTE — Telephone Encounter (Signed)
Last filled 09-20-19 #60 Last OV 08-25-19 Next OV 02-23-20 [facility]

## 2019-11-04 MED ORDER — ONDANSETRON HCL 4 MG PO TABS: 4.0000 mg | ORAL_TABLET | Freq: Three times a day (TID) | ORAL | 0 refills | Status: DC | PRN

## 2019-11-04 NOTE — Telephone Encounter (Signed)
I sent her a message that Dr Alphonsus Sias is out of the office. But I thought I would send it to [facility] for the nausea just in case something can be done.

## 2019-11-18 ENCOUNTER — Other Ambulatory Visit: Payer: Self-pay | Admitting: Internal Medicine

## 2019-11-18 NOTE — Telephone Encounter (Signed)
Last filled 10-20-19 #60 Last OV 08-25-19 Next OV 02-23-20 [facility]

## 2019-12-17 ENCOUNTER — Other Ambulatory Visit: Payer: Self-pay | Admitting: Internal Medicine

## 2019-12-17 NOTE — Telephone Encounter (Signed)
Last filled 11-18-19 #60 Last OV 08-25-19 Next OV 02-23-20 [facility]

## 2020-01-17 ENCOUNTER — Other Ambulatory Visit: Payer: Self-pay | Admitting: Internal Medicine

## 2020-01-17 NOTE — Telephone Encounter (Signed)
Last office visit 08/25/2019 for MDD. Last refilled 12/17/2019 for #60 with no refills.  UDS/Contract 05/29/2015.  Next Appt: 02/23/2020 for 6 month follow up.

## 2020-02-17 ENCOUNTER — Other Ambulatory Visit: Payer: Self-pay | Admitting: Internal Medicine

## 2020-02-17 NOTE — Telephone Encounter (Signed)
Last filled 01-17-20  #60 Last OV 08-25-19 Next OV 02-23-20 [facility]

## 2020-02-23 ENCOUNTER — Other Ambulatory Visit: Payer: Self-pay

## 2020-02-23 ENCOUNTER — Encounter: Payer: Self-pay | Admitting: Internal Medicine

## 2020-02-23 ENCOUNTER — Telehealth: Payer: Self-pay | Admitting: Internal Medicine

## 2020-02-23 NOTE — Progress Notes (Signed)
° °  Subjective:    Patient ID: Alexis Pierce, female    DOB: [birth year], 49 y.o.   MRN: [id number]  HPI  Visit rescheduled---she had to leave for work  Review of Systems     Objective:   Physical Exam         Assessment & Plan:

## 2020-02-24 NOTE — Assessment & Plan Note (Signed)
Visit rescheduled 

## 2020-02-25 ENCOUNTER — Other Ambulatory Visit: Payer: Self-pay

## 2020-02-25 ENCOUNTER — Telehealth: Payer: Self-pay | Admitting: Internal Medicine

## 2020-03-15 ENCOUNTER — Encounter: Payer: Self-pay | Admitting: Internal Medicine

## 2020-03-15 ENCOUNTER — Telehealth (INDEPENDENT_AMBULATORY_CARE_PROVIDER_SITE_OTHER): Payer: Self-pay | Admitting: Internal Medicine

## 2020-03-15 ENCOUNTER — Other Ambulatory Visit: Payer: Self-pay

## 2020-03-15 DIAGNOSIS — F321 Major depressive disorder, single episode, moderate: Secondary | ICD-10-CM

## 2020-03-15 MED ORDER — ONDANSETRON HCL 4 MG PO TABS
4.0000 mg | ORAL_TABLET | Freq: Three times a day (TID) | ORAL | 0 refills | Status: DC | PRN
Start: 2020-03-15 — End: 2020-09-18

## 2020-03-15 MED ORDER — ARIPIPRAZOLE 5 MG PO TABS: 5.0000 mg | ORAL_TABLET | Freq: Every day | ORAL | 5 refills | Status: DC

## 2020-03-15 MED ORDER — ONDANSETRON HCL 4 MG PO TABS: 4.0000 mg | ORAL_TABLET | Freq: Three times a day (TID) | ORAL | 0 refills | Status: DC | PRN

## 2020-03-15 NOTE — Assessment & Plan Note (Signed)
Has worsened again despite the fluoxetine 20mg  daily and the xanax (which helps control her chronic anxiety) Reviewed several choices (?bupropion is the other most reasonable choice) but will try aripiiprazole for augmentation therapy with the fluoxetine Recheck 1 month

## 2020-03-15 NOTE — Progress Notes (Signed)
Subjective:    Patient ID: Alexis Pierce, female    DOB: 1971/05/22, 49 y.o.   MRN: [id number]  HPI Video virtual visit for follow up of chronic depression Identification done Reviewed limitations and billing and she gave consent Participants--patient in her car and I am in my office  "for the past 4 weeks, I have just felt worthless" Doesn't want to be around anyone--husband, grandkids Feels like she is just "going through the motions" Trouble thinking--"I can't put my thoughts together" Is going to work but daily headaches and it is hard to make it in  Taking the fluoxetine 20mg  daily Xanax twice a day  Current Outpatient Medications on File Prior to Visit  Medication Sig Dispense Refill  . ALPRAZolam (XANAX) 1 MG tablet TAKE 1/2 TO 1 TABLETS (0.5-1 MG TOTAL) BY MOUTH 2 (TWO) TIMES DAILY AS NEEDED FOR ANXIETY. 60 tablet 0  . FLUoxetine (PROZAC) 20 MG capsule Take 1 tablet (20 mg total) by mouth daily. On odd days and 2 tablets on even days (Patient taking differently: Take 20 mg by mouth daily. Take 1 tablet (20 mg total) by mouth daily. On odd days and 2 tablets on even days) 90 capsule 6  . meclizine (ANTIVERT) 25 MG tablet Take 1-2 tablets (25-50 mg total) by mouth 3 (three) times daily as needed for dizziness. 60 tablet 3  . naphazoline (NAPHCON) 0.1 % ophthalmic solution Place 2 drops into both eyes 4 (four) times daily as needed for irritation. 15 mL 3  . ondansetron (ZOFRAN) 4 MG tablet Take 1 tablet (4 mg total) by mouth every 8 (eight) hours as needed. 20 tablet 0   No current facility-administered medications on file prior to visit.    Allergies  Allergen Reactions  . Ibuprofen Nausea And Vomiting    Higher Doses 600mg  or more  . Penicillins     Shaking, vomiting  . Hydrocodone-Acetaminophen     REACTION: nausea  . Oxycodone-Acetaminophen     REACTION: nausea    Past Medical History:  Diagnosis Date  . Anxiety 1990  . Depression 2004  . Insomnia   .  Suicidal intent 2004   pills  . Syncope and collapse   . VD (venereal disease)    x 2    Past Surgical History:  Procedure Laterality Date  . ABDOMINAL HYSTERECTOMY  2005   endometriosis    Family History  Problem Relation Age of Onset  . Hypertension Mother   . Stroke Mother        x 2  . Obesity Mother   . Cancer Father        bladder, throat, and prostate  . Cancer Maternal Grandmother        lung    Social History   Socioeconomic History  . Marital status: Married    Spouse name: Not on file  . Number of children: 2  . Years of education: Not on file  . Highest education level: Not on file  Occupational History  . Occupation: Not working currently    Comment:    Tobacco Use  . Smoking status: Never Smoker  . Smokeless tobacco: Never Used  Substance and Sexual Activity  . Alcohol use: No    Alcohol/week: 0.0 standard drinks  . Drug use: No  . Sexual activity: Not on file  Other Topics Concern  . Not on file  Social History Narrative   Patient signed designated party release form and gives Kirstie Larsen (spouse) [phone]  access to medical records.   Social Determinants of Health   Financial Resource Strain:   . Difficulty of Paying Living Expenses: Not on file  Food Insecurity:   . Worried About Programme researcher, broadcasting/film/video in the Last Year: Not on file  . Ran Out of Food in the Last Year: Not on file  Transportation Needs:   . Lack of Transportation (Medical): Not on file  . Lack of Transportation (Non-Medical): Not on file  Physical Activity:   . Days of Exercise per Week: Not on file  . Minutes of Exercise per Session: Not on file  Stress:   . Feeling of Stress : Not on file  Social Connections:   . Frequency of Communication with Friends and Family: Not on file  . Frequency of Social Gatherings with Friends and Family: Not on file  . Attends Religious Services: Not on file  . Active Member of Clubs or Organizations: Not on file  . Attends Tax inspector Meetings: Not on file  . Marital Status: Not on file  Intimate Partner Violence:   . Fear of Current or Ex-Partner: Not on file  . Emotionally Abused: Not on file  . Physically Abused: Not on file  . Sexually Abused: Not on file   Review of Systems  Head congestion ?some vertigo     Objective:   Physical Exam Constitutional:      Appearance: Normal appearance.  Psychiatric:     Comments: Some depression but interacts okay Stress as her grandkids showed up (she was picking them up)            Assessment & Plan:

## 2020-03-15 NOTE — Telephone Encounter (Signed)
Spoke to pt. Looked up meds on GoodRx and both are very reasonably priced at [facility]. I have sent them in for her.

## 2020-03-15 NOTE — Patient Instructions (Signed)
Please start the aripiprazole with 1/2 of the 5mg  tablet at bedtime for 4 days. If you have no problems with it, increase to the full tab nightly then. If you can't afford it, or you don't tolerate it---let me know as soon as possible. Continue the fluoxetine along with the new medication

## 2020-03-20 ENCOUNTER — Other Ambulatory Visit: Payer: Self-pay | Admitting: Internal Medicine

## 2020-03-20 NOTE — Telephone Encounter (Signed)
Last filled 02-17-20 #60 Last OV 03-15-20 Next OV 04-18-20 [facility]

## 2020-04-17 ENCOUNTER — Other Ambulatory Visit: Payer: Self-pay | Admitting: Internal Medicine

## 2020-04-17 NOTE — Telephone Encounter (Signed)
Last filled 03-20-20 #60 Last OV 03-15-20 Next OV 04-18-20 [facility]

## 2020-04-18 ENCOUNTER — Telehealth (INDEPENDENT_AMBULATORY_CARE_PROVIDER_SITE_OTHER): Payer: Self-pay | Admitting: Internal Medicine

## 2020-04-18 ENCOUNTER — Encounter: Payer: Self-pay | Admitting: Internal Medicine

## 2020-04-18 ENCOUNTER — Other Ambulatory Visit: Payer: Self-pay

## 2020-04-18 DIAGNOSIS — F321 Major depressive disorder, single episode, moderate: Secondary | ICD-10-CM

## 2020-04-18 NOTE — Assessment & Plan Note (Signed)
Marked response to augmentation with abilify---adding to chronic fluoxetine Only minor depression days---occasionally Not missing work and feels motivated Appetite is up---she will monitor Will continue Rx and recheck in 3 months Change xanax to prn

## 2020-04-18 NOTE — Progress Notes (Signed)
Subjective:    Patient ID: Alexis Pierce, female    DOB: 15-Mar-1971, 49 y.o.   MRN: [id number]  HPI Telephone virtual visit for follow up of depression Identification done Reviewed limitations and billing and she gave consent Participants--patient at home and I am in my office  She is doing much better now "I feel 100% better---able to get out of bed and get things going" Doing well at work Sleeping well on the abilify---all night  Still will have 1-2 bad days a week----some depression but still functional (like can get to work)  Total time 8 minutes in phone meeting  Current Outpatient Medications on File Prior to Visit  Medication Sig Dispense Refill  . ALPRAZolam (XANAX) 1 MG tablet TAKE 1/2 TO 1 TABLETS (0.5-1 MG TOTAL) BY MOUTH 2 (TWO) TIMES DAILY AS NEEDED FOR ANXIETY. 60 tablet 0  . ARIPiprazole (ABILIFY) 5 MG tablet Take 1 tablet (5 mg total) by mouth at bedtime. 30 tablet 5  . FLUoxetine (PROZAC) 20 MG capsule Take 1 tablet (20 mg total) by mouth daily. On odd days and 2 tablets on even days (Patient taking differently: Take 20 mg by mouth daily. Take 1 tablet (20 mg total) by mouth daily. On odd days and 2 tablets on even days) 90 capsule 6  . meclizine (ANTIVERT) 25 MG tablet Take 1-2 tablets (25-50 mg total) by mouth 3 (three) times daily as needed for dizziness. 60 tablet 3  . naphazoline (NAPHCON) 0.1 % ophthalmic solution Place 2 drops into both eyes 4 (four) times daily as needed for irritation. 15 mL 3  . ondansetron (ZOFRAN) 4 MG tablet Take 1 tablet (4 mg total) by mouth every 8 (eight) hours as needed. 20 tablet 0   No current facility-administered medications on file prior to visit.    Allergies  Allergen Reactions  . Ibuprofen Nausea And Vomiting    Higher Doses 600mg  or more  . Penicillins     Shaking, vomiting  . Hydrocodone-Acetaminophen     REACTION: nausea  . Oxycodone-Acetaminophen     REACTION: nausea    Past Medical History:  Diagnosis  Date  . Anxiety 1990  . Depression 2004  . Insomnia   . Suicidal intent 2004   pills  . Syncope and collapse   . VD (venereal disease)    x 2    Past Surgical History:  Procedure Laterality Date  . ABDOMINAL HYSTERECTOMY  2005   endometriosis    Family History  Problem Relation Age of Onset  . Hypertension Mother   . Stroke Mother        x 2  . Obesity Mother   . Cancer Father        bladder, throat, and prostate  . Cancer Maternal Grandmother        lung    Social History   Socioeconomic History  . Marital status: Married    Spouse name: Not on file  . Number of children: 2  . Years of education: Not on file  . Highest education level: Not on file  Occupational History  . Occupation: Not working currently    Comment:    Tobacco Use  . Smoking status: Never Smoker  . Smokeless tobacco: Never Used  Substance and Sexual Activity  . Alcohol use: No    Alcohol/week: 0.0 standard drinks  . Drug use: No  . Sexual activity: Not on file  Other Topics Concern  . Not on file  Social History  Narrative   Patient signed designated party release form and gives Patrina Andreas (spouse) [phone] access to medical records.   Social Determinants of Health   Financial Resource Strain:   . Difficulty of Paying Living Expenses: Not on file  Food Insecurity:   . Worried About Programme researcher, broadcasting/film/video in the Last Year: Not on file  . Ran Out of Food in the Last Year: Not on file  Transportation Needs:   . Lack of Transportation (Medical): Not on file  . Lack of Transportation (Non-Medical): Not on file  Physical Activity:   . Days of Exercise per Week: Not on file  . Minutes of Exercise per Session: Not on file  Stress:   . Feeling of Stress : Not on file  Social Connections:   . Frequency of Communication with Friends and Family: Not on file  . Frequency of Social Gatherings with Friends and Family: Not on file  . Attends Religious Services: Not on file  . Active Member of  Clubs or Organizations: Not on file  . Attends Banker Meetings: Not on file  . Marital Status: Not on file  Intimate Partner Violence:   . Fear of Current or Ex-Partner: Not on file  . Emotionally Abused: Not on file  . Physically Abused: Not on file  . Sexually Abused: Not on file   Review of Systems Appetite is up Has gained 2-3# Continues on fluoxetine Still using the alprazolam regularly--but is forgetting at times    Objective:   Physical Exam Neurological:     Mental Status: She is alert.  Psychiatric:     Comments: Upbeat and positive            Assessment & Plan:

## 2020-05-17 ENCOUNTER — Other Ambulatory Visit: Payer: Self-pay | Admitting: Internal Medicine

## 2020-05-17 NOTE — Telephone Encounter (Signed)
Last filled 04-17-20 #60 Last OV 04-18-20 Next OV 07-19-20 [facility]

## 2020-06-01 ENCOUNTER — Ambulatory Visit (INDEPENDENT_AMBULATORY_CARE_PROVIDER_SITE_OTHER)
Admission: RE | Admit: 2020-06-01 | Discharge: 2020-06-01 | Disposition: A | Discharge status: Home / Self Care | Payer: Self-pay | Source: Ambulatory Visit | Attending: Family Medicine | Admitting: Family Medicine

## 2020-06-01 ENCOUNTER — Other Ambulatory Visit: Payer: Self-pay

## 2020-06-01 ENCOUNTER — Ambulatory Visit: Payer: Self-pay | Admitting: Family Medicine

## 2020-06-01 ENCOUNTER — Encounter: Payer: Self-pay | Admitting: Family Medicine

## 2020-06-01 VITALS — BP 110/80 | HR 80 | Temp 98.0°F | Ht 63.39 in | Wt 146.8 lb

## 2020-06-01 DIAGNOSIS — M25571 Pain in right ankle and joints of right foot: Secondary | ICD-10-CM

## 2020-06-01 DIAGNOSIS — M79671 Pain in right foot: Secondary | ICD-10-CM

## 2020-06-01 DIAGNOSIS — S92351A Displaced fracture of fifth metatarsal bone, right foot, initial encounter for closed fracture: Secondary | ICD-10-CM

## 2020-06-01 NOTE — Progress Notes (Signed)
Vylet Maffia T. Latravion Graves, MD, CAQ Sports Medicine  Primary Care and Sports Medicine [facility] at [facility] [address] [place], [address]  Phone: [phone]  FAX: [phone]  Alexis Pierce - 49 y.o. female  MRN [id number]  Date of Birth: January 29, 1971  Date: 06/01/2020  PCP: Karie Schwalbe, MD  Referral: Karie Schwalbe, MD  Chief Complaint  Patient presents with  . Ankle Injury    Hurts on the top of her R foot and pinky toe is bruised     This visit occurred during the SARS-CoV-2 public health emergency.  Safety protocols were in place, including screening questions prior to the visit, additional usage of staff PPE, and extensive cleaning of exam room while observing appropriate contact time as indicated for disinfecting solutions.   Subjective:   FLO BERROA is a 49 y.o. very pleasant female patient with Body mass index is 25.69 kg/m. who presents with the following:  Ankle and foot on the R: Patient tripped and had a inversion injury with the right ankle.  Right now she does not complain of any pain at the ankle itself.  No pain in the tibia and fibula.  The top of her foot is extensively bruised and does have some swelling.  She is able to ambulate but bearing weight causes some severe pain.  She does not have any pain in the knee or in the phalanges.  Review of Systems is noted in the HPI, as appropriate   Objective:   BP 110/80 (BP Location: Left Arm, Patient Position: Sitting)   Pulse 80   Temp 98 F (36.7 C) (Temporal)   Ht 5' 3.39" (1.61 m)   Wt 146 lb 12.8 oz (66.6 kg)   SpO2 98%   BMI 25.69 kg/m   Right ankle: The patient is nontender throughout the entirety of the tibia and fibula.  Nontender at the malleoli, talus, calcaneus.  The entirety of the midfoot is nontender.  All phalanges are nontender and they move without any difficulty.  The third fourth and fifth are quite tender to palpation in the fifth is the  greatest.  There is extensive ecchymosis and bruising as well as swelling.  Radiology: No results found.  Assessment and Plan:     ICD-10-CM   1. Closed non-physeal fracture of fifth metatarsal bone of right foot, initial encounter  S92.351A   2. Acute right ankle pain  M25.571   3. Acute foot pain, right  M79.671 DG Foot Complete Right   Patient has a closed metatarsal shaft fracture on the fifth.  Pain at the ankle and other bone in the foot does not appear to be secondary to fracture but would be consistent with bone contusion and soft tissue damage.  Most of these do well.  I placed the patient in a pneumatic short cam walker boot.  She understands that she cannot drive an automobile with her immobilization in place.  No orders of the defined types were placed in this encounter.  There are no discontinued medications. Orders Placed This Encounter  Procedures  . DG Foot Complete Right    Follow-up: Return in about 2 weeks (around 06/15/2020).  Signed,  Elpidio Galea. Avanish Cerullo, MD   Outpatient Encounter Medications as of 06/01/2020  Medication Sig  . ALPRAZolam (XANAX) 1 MG tablet TAKE 1/2 TO 1 TABLETS (0.5-1 MG TOTAL) BY MOUTH 2 (TWO) TIMES DAILY AS NEEDED FOR ANXIETY.  Marland Kitchen ARIPiprazole (ABILIFY) 5 MG tablet Take  1 tablet (5 mg total) by mouth at bedtime.  Marland Kitchen FLUoxetine (PROZAC) 20 MG capsule Take 1 tablet (20 mg total) by mouth daily. On odd days and 2 tablets on even days (Patient taking differently: Take 20 mg by mouth daily. Take 1 tablet (20 mg total) by mouth daily. On odd days and 2 tablets on even days)  . meclizine (ANTIVERT) 25 MG tablet Take 1-2 tablets (25-50 mg total) by mouth 3 (three) times daily as needed for dizziness.  . naphazoline (NAPHCON) 0.1 % ophthalmic solution Place 2 drops into both eyes 4 (four) times daily as needed for irritation.  . ondansetron (ZOFRAN) 4 MG tablet Take 1 tablet (4 mg total) by mouth every 8 (eight) hours as needed.   No  facility-administered encounter medications on file as of 06/01/2020.

## 2020-06-07 ENCOUNTER — Telehealth: Payer: Self-pay | Admitting: Internal Medicine

## 2020-06-07 NOTE — Telephone Encounter (Signed)
Patient calling back about status of her letter. Can you please advise her when the note is ready ? EM

## 2020-06-07 NOTE — Telephone Encounter (Signed)
Yes, totally fine

## 2020-06-07 NOTE — Telephone Encounter (Signed)
Pt called in wanted to know if she can get a note until next Thursday when she has a follow up appointment and she has to stand and walk and her foot is in pain

## 2020-06-07 NOTE — Telephone Encounter (Signed)
Letter written and sent to patient's MyChart.  Spoke with patient.  She would also like to pick up a copy of the letter as well.  Letter printed and placed up front for patient to pick up.

## 2020-06-15 ENCOUNTER — Encounter: Payer: Self-pay | Admitting: Family Medicine

## 2020-06-15 ENCOUNTER — Other Ambulatory Visit: Payer: Self-pay | Admitting: Internal Medicine

## 2020-06-15 ENCOUNTER — Other Ambulatory Visit: Payer: Self-pay

## 2020-06-15 ENCOUNTER — Ambulatory Visit: Payer: Self-pay | Admitting: Family Medicine

## 2020-06-15 VITALS — BP 104/70 | HR 82 | Temp 98.5°F | Ht 63.39 in | Wt 151.5 lb

## 2020-06-15 DIAGNOSIS — S92351A Displaced fracture of fifth metatarsal bone, right foot, initial encounter for closed fracture: Secondary | ICD-10-CM

## 2020-06-15 NOTE — Progress Notes (Signed)
Alexis Arizpe T. Elio Haden, MD, CAQ Sports Medicine  Primary Care and Sports Medicine [facility] [address] [place], [address]  Phone: [phone]  FAX: [phone]  Alexis Pierce - 49 y.o. female  MRN [id number]  Date of Birth: 06-11-1971  Date: 06/15/2020  PCP: Alexis Schwalbe, MD  Referral: Alexis Schwalbe, MD  Chief Complaint  Patient presents with  . Follow-up    Right Foot Fx    This visit occurred during the SARS-CoV-2 public health emergency.  Safety protocols were in place, including screening questions prior to the visit, additional usage of staff PPE, and extensive cleaning of exam room while observing appropriate contact time as indicated for disinfecting solutions.   Subjective:   Alexis Pierce is a 49 y.o. very pleasant female patient with Body mass index is 26.51 kg/m. who presents with the following:  Fracture f/u  Distal 5th MT fracture  WOrks at [employer] and does a lot of work, but has to be on her feet most of the time.    When we spoke last week, I did decide that you needed to take her out of work because she was still having some very significant pain in her fifth metatarsal are trying to walk and do her usual work.  She does very clearly have a fracture.  She does have still quite a bit of tenderness.  Review of Systems is noted in the HPI, as appropriate   Objective:   BP 104/70   Pulse 82   Temp 98.5 F (36.9 C) (Temporal)   Ht 5' 3.39" (1.61 m)   Wt 151 lb 8 oz (68.7 kg)   SpO2 96%   BMI 26.51 kg/m   She is very tender on the fifth metatarsal and not significantly at the phalanges or other metatarsal shafts.  The midfoot and hindfoot are nontender as well as the tibia and fibula.  Radiology: DG Foot Complete Right  Result Date: 06/01/2020 CLINICAL DATA:  Pain following trauma EXAM: RIGHT FOOT COMPLETE - 3+ VIEW COMPARISON:  January 11, 2016 FINDINGS: Frontal, oblique, and  lateral views were obtained. There is a fracture of the distal fifth metatarsal diaphysis with alignment near anatomic. No other fracture. No dislocation. Joint spaces appear normal. No erosive change. There is a small inferior calcaneal spur. IMPRESSION: Fracture distal fifth metatarsal diaphysis with alignment at the fracture site near anatomic. No other fracture evident. No dislocation. No appreciable arthropathy. Small inferior calcaneal spur. These results will be called to the ordering clinician or representative by the Radiologist Assistant, and communication documented in the PACS or [employer]. Electronically Signed   By: Bretta Bang III M.D.   On: 06/01/2020 16:14    Assessment and Plan:     ICD-10-CM   1. Closed non-physeal fracture of fifth metatarsal bone of right foot, initial encounter  S92.351A    Total encounter time: 10 minutes. This includes total time spent on the day of encounter.  Quick recheck, appears stable.  Reviewed cam walker positioning again.  I do think she is going to need to stay out of work for the next few weeks and I will recheck her at that point.  Follow-up: Return in about 2 weeks (around 06/29/2020) for foot recheck.  Signed,  Elpidio Galea. Hailei Besser, MD   Outpatient Encounter Medications as of 06/15/2020  Medication Sig  . ARIPiprazole (ABILIFY) 5 MG tablet Take 1 tablet (5 mg total) by mouth  at bedtime.  Marland Kitchen FLUoxetine (PROZAC) 20 MG capsule Take 20 mg by mouth daily.  . meclizine (ANTIVERT) 25 MG tablet Take 1-2 tablets (25-50 mg total) by mouth 3 (three) times daily as needed for dizziness.  . naphazoline (NAPHCON) 0.1 % ophthalmic solution Place 2 drops into both eyes 4 (four) times daily as needed for irritation.  . ondansetron (ZOFRAN) 4 MG tablet Take 1 tablet (4 mg total) by mouth every 8 (eight) hours as needed.  . [DISCONTINUED] ALPRAZolam (XANAX) 1 MG tablet TAKE 1/2 TO 1 TABLETS (0.5-1 MG TOTAL) BY MOUTH 2 (TWO) TIMES DAILY AS NEEDED  FOR ANXIETY.  . [DISCONTINUED] FLUoxetine (PROZAC) 20 MG capsule Take 1 tablet (20 mg total) by mouth daily. On odd days and 2 tablets on even days (Patient taking differently: Take 20 mg by mouth daily. Take 1 tablet (20 mg total) by mouth daily. On odd days and 2 tablets on even days)   No facility-administered encounter medications on file as of 06/15/2020.

## 2020-06-15 NOTE — Telephone Encounter (Signed)
Last filled 05-17-20 #60 Last OV 06-01-20 [facility] Next OV 07-19-20 [facility]

## 2020-06-28 ENCOUNTER — Other Ambulatory Visit: Payer: Self-pay | Admitting: Internal Medicine

## 2020-06-29 ENCOUNTER — Ambulatory Visit: Payer: Self-pay | Admitting: Internal Medicine

## 2020-07-04 ENCOUNTER — Telehealth: Payer: Self-pay | Admitting: Internal Medicine

## 2020-07-04 NOTE — Telephone Encounter (Signed)
Work note written as instructed by Dr. Patsy Lager.  [employer]ception notified by telephone that new work note has been sent to her MyChart.

## 2020-07-04 NOTE — Telephone Encounter (Signed)
Patient called in asking for an extension on her being off of work due to her broken foot. She doesn't feel comfortable driving and didn't want to be out on the ice. Patient was seen by [place]. Patient is asking for a note of excuse. EM

## 2020-07-04 NOTE — Telephone Encounter (Signed)
Let's extend her note until next Monday, thanks.

## 2020-07-05 NOTE — Telephone Encounter (Signed)
Yes, new work note was provided yesterday for Ms. Alexis Pierce.

## 2020-07-05 NOTE — Telephone Encounter (Signed)
You have an appt on 1/27--can you push this up some?

## 2020-07-05 NOTE — Telephone Encounter (Signed)
I think I already said that this is ok, and I will recheck her at her appointment.  OK to extend note.

## 2020-07-13 ENCOUNTER — Ambulatory Visit: Payer: Self-pay | Admitting: Family Medicine

## 2020-07-13 DIAGNOSIS — Z0289 Encounter for other administrative examinations: Secondary | ICD-10-CM

## 2020-07-16 NOTE — Progress Notes (Signed)
Alexis Deeley T. Maury Groninger, MD, CAQ Sports Medicine  Primary Care and Sports Medicine [facility] [address] [place], [address]  Phone: [phone]   FAX: [phone]  Alexis Pierce - 50 y.o. female   MRN [id number]   Date of Birth: 1970-11-04  Date: 07/17/2020   PCP: Karie Schwalbe, MD   Referral: Karie Schwalbe, MD  Chief Complaint  Patient presents with   Follow-up    Closed non-physeal fracture of fifth metatarsal bone of right foot    This visit occurred during the SARS-CoV-2 public health emergency.  Safety protocols were in place, including screening questions prior to the visit, additional usage of staff PPE, and extensive cleaning of exam room while observing appropriate contact time as indicated for disinfecting solutions.   Subjective:   Alexis Pierce is a 50 y.o. very pleasant female patient with Body mass index is 27.73 kg/m. who presents with the following:  She is a pleasant young lady, she is a here in follow-up for her fifth metatarsal fracture.  This was a distal fifth metatarsal fracture.  She was having quite a bit of difficulty walking and standing on her feet, so I took her out of work for 1 month.  I have placed her in a cam walker boot, and she is here today in total 6 weeks after initial immobilization.  She does feel quite a bit better, she is having only mild to minimal pain with walking.  She is not walking with a limp anymore.  She has been able to walk and has been wearing her cam walker boot at work.  This is not bothering her all that much.  Review of Systems is noted in the HPI, as appropriate   Objective:   BP 100/70    Pulse 80    Temp 98 F (36.7 C) (Temporal)    Ht 5' 3.39" (1.61 m)    Wt 158 lb 8 oz (71.9 kg)    SpO2 97%    BMI 27.73 kg/m   Minimal limp.  Nontender throughout the ankle and foot from a bony anatomy standpoint with the exception of some modest pain at the distal fifth  metatarsal.  All ligamentous structures are intact and strength is 5/5.  Otherwise neurovascularly intact.  Radiology: DG Foot Complete Right  Result Date: 07/17/2020 CLINICAL DATA:  Follow-up right foot fracture. EXAM: RIGHT FOOT COMPLETE - 3+ VIEW COMPARISON:  Radiograph 06/01/2020 FINDINGS: Distal fifth metatarsal shaft fracture again seen. There is slight increased displacement and resorption at the fracture site laterally. Interval incomplete bridging callus medially. No intra-articular extension. Remainder the exam is unchanged. No new fracture. IMPRESSION: Slight increased displacement and resorption at the distal fifth metatarsal shaft fracture from exam earlier this month. Interval incomplete bridging callus medially. Recommend continued follow-up. Electronically Signed   By: Narda Rutherford M.D.   On: 07/17/2020 20:26     Assessment and Plan:     ICD-10-CM   1. Closed non-physeal fracture of fifth metatarsal bone of right foot, initial encounter  S92.351A DG Foot Complete Right   I do agree that there has been some mild increase displacement at the fracture site -we discussed this when we looked at her x-rays together.  She does have good callus formation, but there is not radiographic evidence of healing.  She is moving much better and has dramatically improved pain.  At this point I am going to have her keep her cam  walker boot on for 3 weeks, then transition out of this only wearing it at work.  I will recheck her in 4 weeks.  Orders Placed This Encounter  Procedures   DG Foot Complete Right    Follow-up: Return in about 1 month (around 08/14/2020).  Signed,  Elpidio Galea. Aribella Vavra, MD   Outpatient Encounter Medications as of 07/17/2020  Medication Sig   ARIPiprazole (ABILIFY) 5 MG tablet Take 1 tablet (5 mg total) by mouth at bedtime.   FLUoxetine (PROZAC) 20 MG capsule TAKE ONE CAPSULE BY MOUTH ON ODD DAYS AND TAKE TWO CAPSULES BY MOUTH ON EVEN DAYS   meclizine  (ANTIVERT) 25 MG tablet Take 1-2 tablets (25-50 mg total) by mouth 3 (three) times daily as needed for dizziness.   naphazoline (NAPHCON) 0.1 % ophthalmic solution Place 2 drops into both eyes 4 (four) times daily as needed for irritation.   ondansetron (ZOFRAN) 4 MG tablet Take 1 tablet (4 mg total) by mouth every 8 (eight) hours as needed.   [DISCONTINUED] ALPRAZolam (XANAX) 1 MG tablet TAKE 1/2 TO 1 TABLETS (0.5-1 MG TOTAL) BY MOUTH 2 (TWO) TIMES DAILY AS NEEDED FOR ANXIETY.   No facility-administered encounter medications on file as of 07/17/2020.

## 2020-07-17 ENCOUNTER — Other Ambulatory Visit: Payer: Self-pay | Admitting: Internal Medicine

## 2020-07-17 ENCOUNTER — Ambulatory Visit (INDEPENDENT_AMBULATORY_CARE_PROVIDER_SITE_OTHER)
Admission: RE | Admit: 2020-07-17 | Discharge: 2020-07-17 | Disposition: A | Discharge status: Home / Self Care | Payer: Self-pay | Source: Ambulatory Visit | Attending: Family Medicine | Admitting: Family Medicine

## 2020-07-17 ENCOUNTER — Other Ambulatory Visit: Payer: Self-pay

## 2020-07-17 ENCOUNTER — Ambulatory Visit: Payer: Self-pay | Admitting: Family Medicine

## 2020-07-17 ENCOUNTER — Encounter: Payer: Self-pay | Admitting: Family Medicine

## 2020-07-17 VITALS — BP 100/70 | HR 80 | Temp 98.0°F | Ht 63.39 in | Wt 158.5 lb

## 2020-07-17 DIAGNOSIS — S92351A Displaced fracture of fifth metatarsal bone, right foot, initial encounter for closed fracture: Secondary | ICD-10-CM

## 2020-07-17 NOTE — Patient Instructions (Signed)
Still wear CAM walker over the next 2 weeks, then wean out of it for 1 week.  Then try out be out of your boot, but if you have significant pain, go back in it.

## 2020-07-17 NOTE — Telephone Encounter (Signed)
Last filled 06-15-20 #60 Last OV Acute 06-15-20 Next OV 07-19-20 [facility]

## 2020-07-19 ENCOUNTER — Ambulatory Visit (INDEPENDENT_AMBULATORY_CARE_PROVIDER_SITE_OTHER): Payer: Self-pay | Admitting: Internal Medicine

## 2020-07-19 ENCOUNTER — Other Ambulatory Visit: Payer: Self-pay

## 2020-07-19 ENCOUNTER — Encounter: Payer: Self-pay | Admitting: Internal Medicine

## 2020-07-19 DIAGNOSIS — F321 Major depressive disorder, single episode, moderate: Secondary | ICD-10-CM

## 2020-07-19 NOTE — Progress Notes (Signed)
Subjective:    Patient ID: Alexis Pierce, female    DOB: Jul 13, 1970, 50 y.o.   MRN: [id number]  HPI Here for follow up of major depression This visit occurred during the SARS-CoV-2 public health emergency.  Safety protocols were in place, including screening questions prior to the visit, additional usage of staff PPE, and extensive cleaning of exam room while observing appropriate contact time as indicated for disinfecting solutions.   Depression still under control Happy with the abilify Appetite definitely increased Has gained 10# since starting  Also home due to broken foot So not as active   Current Outpatient Medications on File Prior to Visit  Medication Sig Dispense Refill  . ALPRAZolam (XANAX) 1 MG tablet TAKE 1/2 TO 1 TABLETS (0.5-1 MG TOTAL) BY MOUTH 2 (TWO) TIMES DAILY AS NEEDED FOR ANXIETY. 60 tablet 0  . ARIPiprazole (ABILIFY) 5 MG tablet Take 1 tablet (5 mg total) by mouth at bedtime. 30 tablet 5  . FLUoxetine (PROZAC) 20 MG capsule TAKE ONE CAPSULE BY MOUTH ON ODD DAYS AND TAKE TWO CAPSULES BY MOUTH ON EVEN DAYS 90 capsule 3  . meclizine (ANTIVERT) 25 MG tablet Take 1-2 tablets (25-50 mg total) by mouth 3 (three) times daily as needed for dizziness. 60 tablet 3  . naphazoline (NAPHCON) 0.1 % ophthalmic solution Place 2 drops into both eyes 4 (four) times daily as needed for irritation. 15 mL 3  . ondansetron (ZOFRAN) 4 MG tablet Take 1 tablet (4 mg total) by mouth every 8 (eight) hours as needed. 20 tablet 0   No current facility-administered medications on file prior to visit.    Allergies  Allergen Reactions  . Ibuprofen Nausea And Vomiting    Higher Doses 600mg  or more  . Penicillins     Shaking, vomiting  . Hydrocodone-Acetaminophen     REACTION: nausea  . Oxycodone-Acetaminophen     REACTION: nausea    Past Medical History:  Diagnosis Date  . Anxiety 1990  . Depression 2004  . Insomnia   . Suicidal intent 2004   pills  . Syncope and collapse    . VD (venereal disease)    x 2    Past Surgical History:  Procedure Laterality Date  . ABDOMINAL HYSTERECTOMY  2005   endometriosis    Family History  Problem Relation Age of Onset  . Hypertension Mother   . Stroke Mother        x 2  . Obesity Mother   . Cancer Father        bladder, throat, and prostate  . Cancer Maternal Grandmother        lung    Social History   Socioeconomic History  . Marital status: Married    Spouse name: Not on file  . Number of children: 2  . Years of education: Not on file  . Highest education level: Not on file  Occupational History  . Occupation: 2006    Comment:    Tobacco Use  . Smoking status: Never Smoker  . Smokeless tobacco: Never Used  Substance and Sexual Activity  . Alcohol use: No    Alcohol/week: 0.0 standard drinks  . Drug use: No  . Sexual activity: Not on file  Other Topics Concern  . Not on file  Social History Narrative   Patient signed designated party release form and gives Drusilla Wampole (spouse) [phone] access to medical records.   Social Determinants of Health   Financial Resource Strain: Not  on file  Food Insecurity: Not on file  Transportation Needs: Not on file  Physical Activity: Not on file  Stress: Not on file  Social Connections: Not on file  Intimate Partner Violence: Not on file   Review of Systems Sleeps okay--works nights able to sleep Xanax helps her settle down    Objective:   Physical Exam Constitutional:      Appearance: Normal appearance.  Neurological:     Mental Status: She is alert.  Psychiatric:        Mood and Affect: Mood normal.        Behavior: Behavior normal.            Assessment & Plan:

## 2020-07-19 NOTE — Assessment & Plan Note (Signed)
Still in remission On abilify and fluoxetine Just concerned about appetite/weight gain  Will consider decrease to 2mg  of abilify daily in 3 months----that would be 6 months of remission

## 2020-07-19 NOTE — Patient Instructions (Signed)
Please look into health insurance from the government at [email or web address]. If you are still doing well in 3 months---let me know and we can consider decreasing the aripiprazole. (I would not recommend this if you haven't gained any more weight)

## 2020-08-14 ENCOUNTER — Ambulatory Visit: Payer: Self-pay | Admitting: Family Medicine

## 2020-08-16 ENCOUNTER — Ambulatory Visit: Payer: Self-pay | Admitting: Family Medicine

## 2020-08-17 ENCOUNTER — Other Ambulatory Visit: Payer: Self-pay | Admitting: Internal Medicine

## 2020-08-17 NOTE — Telephone Encounter (Signed)
Last filled 07-17-20 #60 Last OV 07-19-20 Next OV 01-18-21 [facility]

## 2020-08-21 ENCOUNTER — Ambulatory Visit: Payer: Self-pay | Admitting: Family Medicine

## 2020-09-17 ENCOUNTER — Other Ambulatory Visit: Payer: Self-pay | Admitting: Internal Medicine

## 2020-09-18 NOTE — Telephone Encounter (Signed)
Last filled 3-3--22 #60 Last OV 07-19-20 Next OV 01-18-21 [facility]

## 2020-10-16 ENCOUNTER — Telehealth: Payer: Self-pay | Admitting: *Deleted

## 2020-10-16 NOTE — Telephone Encounter (Signed)
It is very disappointing that she stopped medications without consulting me. Fluoxetine does not cause weight gain---so she should never have stopped that (and probably all this wouldn't have happened if she hadn't). The abilify can cause weight gain--but we should have discussed whether to stop it, or if we should substitute something else, before she stopped it. Have her restart the fluoxetine immediately. She is going to need to come in for an appointment to review all this---soon.

## 2020-10-16 NOTE — Telephone Encounter (Signed)
Patient called stating that she stopped taking her Abilify and Prozac about 5 weeks ago because of weight gain. Patient stated that she has been taking her Xanax. Patient stated that she is having a hard time today. Patient stated that she can't concentrate and is having a hard time thinking. Patient stated that she feels like she is in a panic and is very anxious. Patient was very tearful and crying. Patient stated that she does not feel that she will harm herself or anyone else. Patient stated that she is having chest tightness and some difficulty breathing but feels that is because she is very anxious. Patient denies chest pain. Patient stated that she is scheduled to go to work at 2:00 pm but knows that she is unable to go today. Patient was advised if she is unable to go to work today she needs to call her employer and let them know. Patient checked her heart rate and it was 115. Patient stated that she did take her xanax this morning. Patient was advised that she needs to sit down and relax and do something that she enjoys doing. Patient stated that she thinks that she is going to lay down and try to relax. Patient stated that she is alone and does not really want to leave her house. Patient was advised if she should start with chest pain or unable to breathe she should call 911 and she verbalized understanding.  Advised patient that this message will go back to Dr. Alphonsus Sias but he is out of the office this morning and she will get a call back as soon as he responds to the message. Patient seemed to be a little calmer before the phone call ended.

## 2020-10-16 NOTE — Telephone Encounter (Signed)
Spoke to pt. She will restart the fluoxetine now. I made her an appt for 10-19-20 at 2:15.

## 2020-10-18 ENCOUNTER — Other Ambulatory Visit: Payer: Self-pay | Admitting: Internal Medicine

## 2020-10-18 NOTE — Telephone Encounter (Signed)
Last filled 09-18-20 #60 Last OV 07-19-20 Next OV 10-19-20 [facility]

## 2020-10-19 ENCOUNTER — Ambulatory Visit: Payer: Self-pay | Admitting: Internal Medicine

## 2020-10-26 ENCOUNTER — Encounter: Payer: Self-pay | Admitting: Internal Medicine

## 2020-10-26 ENCOUNTER — Ambulatory Visit (INDEPENDENT_AMBULATORY_CARE_PROVIDER_SITE_OTHER): Payer: Self-pay | Admitting: Internal Medicine

## 2020-10-26 ENCOUNTER — Other Ambulatory Visit: Payer: Self-pay

## 2020-10-26 DIAGNOSIS — F321 Major depressive disorder, single episode, moderate: Secondary | ICD-10-CM

## 2020-10-26 NOTE — Progress Notes (Signed)
Subjective:    Patient ID: Alexis Pierce, female    DOB: 01/19/1971, 50 y.o.   MRN: [id number]  HPI Here for follow up of depression Had stopped her meds This visit occurred during the SARS-CoV-2 public health emergency.  Safety protocols were in place, including screening questions prior to the visit, additional usage of staff PPE, and extensive cleaning of exam room while observing appropriate contact time as indicated for disinfecting solutions.   Stopped the meds about 6 weeks Mostly due to weight gain---mom and boss both told her she was getting fat Coworker told her to go on diet  "I really made myself miserable" Restarted fluoxetine at my recommendation Doesn't feel any better----"I feel numb" No suicidal thoughts Has been back to work---but "I just want to stay in bed" "My head is all jumbled up"  Has been working as much as 14-16 hours a day Then boss cut hours when she refused to leave for an hour and come back to close (had been working too much)  Still taking xanax bid  Current Outpatient Medications on File Prior to Visit  Medication Sig Dispense Refill  . ALPRAZolam (XANAX) 1 MG tablet TAKE 1/2 TO 1 TABLETS (0.5-1 MG TOTAL) BY MOUTH 2 (TWO) TIMES DAILY AS NEEDED FOR ANXIETY. 60 tablet 0  . FLUoxetine (PROZAC) 20 MG capsule TAKE ONE CAPSULE BY MOUTH ON ODD DAYS AND TAKE TWO CAPSULES BY MOUTH ON EVEN DAYS (Patient taking differently: Take 20 mg by mouth daily.) 90 capsule 3  . meclizine (ANTIVERT) 25 MG tablet Take 1-2 tablets (25-50 mg total) by mouth 3 (three) times daily as needed for dizziness. 60 tablet 3  . naphazoline (NAPHCON) 0.1 % ophthalmic solution Place 2 drops into both eyes 4 (four) times daily as needed for irritation. 15 mL 3  . ondansetron (ZOFRAN) 4 MG tablet TAKE ONE TABLET BY MOUTH EVERY 8 HOURS AS NEEDED 20 tablet 0   No current facility-administered medications on file prior to visit.    Allergies  Allergen Reactions  . Ibuprofen Nausea  And Vomiting    Higher Doses 600mg  or more  . Penicillins     Shaking, vomiting  . Hydrocodone-Acetaminophen     REACTION: nausea  . Oxycodone-Acetaminophen     REACTION: nausea    Past Medical History:  Diagnosis Date  . Anxiety 1990  . Depression 2004  . Insomnia   . Suicidal intent 2004   pills  . Syncope and collapse   . VD (venereal disease)    x 2    Past Surgical History:  Procedure Laterality Date  . ABDOMINAL HYSTERECTOMY  2005   endometriosis    Family History  Problem Relation Age of Onset  . Hypertension Mother   . Stroke Mother        x 2  . Obesity Mother   . Cancer Father        bladder, throat, and prostate  . Cancer Maternal Grandmother        lung    Social History   Socioeconomic History  . Marital status: Married    Spouse name: Not on file  . Number of children: 2  . Years of education: Not on file  . Highest education level: Not on file  Occupational History  . Occupation: 2006    Comment:    Tobacco Use  . Smoking status: Never Smoker  . Smokeless tobacco: Never Used  Substance and Sexual Activity  . Alcohol use:  No    Alcohol/week: 0.0 standard drinks  . Drug use: No  . Sexual activity: Not on file  Other Topics Concern  . Not on file  Social History Narrative   Patient signed designated party release form and gives Pauletta Pickney (spouse) [phone] access to medical records.   Social Determinants of Health   Financial Resource Strain: Not on file  Food Insecurity: Not on file  Transportation Needs: Not on file  Physical Activity: Not on file  Stress: Not on file  Social Connections: Not on file  Intimate Partner Violence: Not on file   Review of Systems  Sleeps 4-5 hours a night Still with increased appetite --even off the abilify     Objective:   Physical Exam Constitutional:      Appearance: Normal appearance.  Neurological:     Mental Status: She is alert.  Psychiatric:     Comments:  Some depression Appropriate speech and appearance            Assessment & Plan:

## 2020-10-26 NOTE — Patient Instructions (Signed)
Please send me an email in 3 weeks if your depression is not much better. I will plan to start bupropion.

## 2020-10-26 NOTE — Assessment & Plan Note (Signed)
Had done really well on fluoxetine and aripiprazole Unfortunately gained 20#---and stopped both Just restarted the fluoxetine--too soon to assess response If not improving in 3 weeks, will add bupropion

## 2020-11-16 ENCOUNTER — Telehealth: Payer: Self-pay

## 2020-11-16 MED ORDER — BUPROPION HCL ER (SR) 150 MG PO TB12: 150.0000 mg | ORAL_TABLET | Freq: Every day | ORAL | 3 refills | Status: DC

## 2020-11-16 NOTE — Telephone Encounter (Signed)
Spoke to pt. Made appt for 12-27-20.

## 2020-11-16 NOTE — Telephone Encounter (Signed)
I sent the prescription. She should start at daily but if no better in about 2-3 weeks, I would have her increase to bid Set up appt for 1-2 months to review

## 2020-11-16 NOTE — Telephone Encounter (Signed)
Pt left v/m that she was to call in with update on how she is doing since 10/26/20 visit. Pt does not feel any different than when she had visit on 10/26/20; pt still does not feel right. Per note on 10/26/20 after update of pt and no improvement in depression may start bupropion. Sending note to [facility] CMA and Dr Alphonsus Sias. I spoke with pt to see which pharmacy if med was sent in for it to go to and pt [facility]. No SI/HI. Pt request cb after DR Alphonsus Sias reviews the note.

## 2020-11-17 ENCOUNTER — Other Ambulatory Visit: Payer: Self-pay | Admitting: Internal Medicine

## 2020-11-17 NOTE — Telephone Encounter (Signed)
Last filled 10-18-20 #60 Last OV 10-26-20 Next OV 12-27-20 [facility]

## 2020-11-21 ENCOUNTER — Telehealth: Payer: Self-pay

## 2020-11-21 NOTE — Telephone Encounter (Signed)
Noted, I do feel she needs evaluation for the chest discomfort and agree with ER if worsening

## 2020-11-21 NOTE — Telephone Encounter (Signed)
I spoke with pt; pt said she called her employer and pt was advised to come to work; so pt went to work and cannot leave until 6 PM. 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 Pt said if her condition changed or worsened would call 911 to pick her up. Pt will cb with update of how doing. Sending note to Dr Alphonsus Sias who is out of office and Dr Selena Batten who is in office.

## 2020-11-21 NOTE — Telephone Encounter (Signed)
[facility] Day - Client TELEPHONE ADVICE RECORD AccessNurse Patient Name: Alexis Pierce Gender: Female DOB: 12-03-1970 Age: 50 Y 10 M 11 D Return Phone Number: [phone] (Primary) Address: [place]/ [place]/ [place] [address] Client [facility] Day - Client Client Site [facility] - Day Physician Tillman Abide- MD Contact Type Call Who Is Calling Patient / Member / Family / Caregiver Call Type Triage / Clinical Relationship To Patient Self Return Phone Number [phone] (Primary) Chief Complaint CHEST PAIN - pain, pressure, heaviness or tightness Reason for Call Symptomatic / Request for Health Information Initial Comment Transferred from office, no appts till next Monday, PT has been vomiting, sinus headaches, abdoiminal pain,coughing and just got her voice back today, PT states she also feels like a elephant is sitting on her chest. Translation No Nurse Assessment Nurse: D'Heur Ezzard Standing, RN, Adrienne Date/Time (Eastern Time): 11/21/2020 11:39:53 AM Confirm and document reason for call. If symptomatic, describe symptoms. ---Caller states she has been vomiting, has sinus headache, abdominal pain,coughing and just got her voice back today, states she also feels like a elephant is sitting on her chest. S/S started last Tuesday. No fever. Covid test was negative. Does the patient have any new or worsening symptoms? ---Yes Will a triage be completed? ---Yes Related visit to physician within the last 2 weeks? ---No Does the PT have any chronic conditions? (i.e. diabetes, asthma, this includes High risk factors for pregnancy, etc.) ---Yes List chronic conditions. ---depression, anxiety Is the patient pregnant or possibly pregnant? (Ask all females between the ages of 30-55) ---No Is this a behavioral health or substance abuse call? ---No Guidelines Guideline Title Affirmed Question  Affirmed Notes Nurse Date/Time (Eastern Time) COVID-19 - Diagnosed or Suspected MILD difficulty breathing (e.g., minimal/no SOB D'Heur Ezzard Standing, RN, Hansel Starling 11/21/2020 11:42:38 AM PLEASE NOTE: All timestamps contained within this report are represented as [place] Standard Time. CONFIDENTIALTY NOTICE: This fax transmission is intended only for the addressee. It contains information that is legally privileged, confidential or otherwise protected from use or disclosure. If you are not the intended recipient, you are strictly prohibited from reviewing, disclosing, copying using or disseminating any of this information or taking any action in reliance on or regarding this information. If you have received this fax in error, please notify us immediately by telephone so that we can arrange for its return to [place]. Phone: [phone], Toll-Free: [phone], Fax: [phone] Page: 2 of 2 Call Id: [id number] Guidelines Guideline Title Affirmed Question Affirmed Notes Nurse Date/Time Lamount Cohen Time) at rest, SOB with walking, pulse <100) Disp. Time Lamount Cohen Time) Disposition Final User 11/21/2020 11:37:06 AM Send to Urgent Queue Jory Ee 11/21/2020 11:50:56 AM See HCP within 4 Hours (or PCP triage) Yes D'Heur Ezzard Standing, RN, Maple Hudson Disagree/Comply Comply Caller Understands Yes PreDisposition Call Doctor Care Advice Given Per Guideline SEE HCP (OR PCP TRIAGE) WITHIN 4 HOURS: * Sore throat: Try throat lozenges, hard candy or warm chicken broth. * Feeling dehydrated: Drink extra liquids. If the air in your home is dry, use a humidifier. CALL BACK IF: * You become worse CARE ADVICE given per COVID-19 - DIAGNOSED OR SUSPECTED (Adult) guideline. Comments User: Hansel Starling, D'Heur Ezzard Standing, RN Date/Time Lamount Cohen Time): 11/21/2020 11:43:55 AM Caller has had vertigo for two years. User: Hansel Starling, D'Heur Ezzard Standing, RN Date/Time Lamount Cohen Time): 11/21/2020 11:45:57 AM Caller states she has constant, mild  tightness in her chest that worsens with coughing. User: Hansel Starling, D'Heur Ezzard Standing, RN Date/Time Lamount Cohen Time):  11/21/2020 11:51:35 AM Advised UC since office has no appointments. Pt to choose one and go in for evaluation. Referrals GO TO FACILITY UNDECIDED

## 2020-12-08 ENCOUNTER — Other Ambulatory Visit: Payer: Self-pay | Admitting: Internal Medicine

## 2020-12-15 ENCOUNTER — Other Ambulatory Visit: Payer: Self-pay | Admitting: Internal Medicine

## 2020-12-15 NOTE — Telephone Encounter (Signed)
Pt left v/m that pharmacy advised pt to call to get refill on alprazolam 1 mg done today.   Name of Medication: alprazolam 1 mg Name of [facility]acy: [facility] Last Fill or Written Date and Quantity: #60 on 11/19/20 Last Office Visit and Type: 10/26/2020 anxiety and depression Next Office Visit and Type: 12/27/2020 FU anxiety and depression

## 2020-12-15 NOTE — Telephone Encounter (Signed)
  LAST APPOINTMENT DATE: 12/08/2020   NEXT APPOINTMENT DATE:@7 /13/2022  MEDICATION:ALPRAZolam [name]) 1 MG tablet [id number] [place] [place] [facility] [address]  Let patient know to contact pharmacy at the end of the day to make sure medication is ready.  Please notify patient to allow 48-72 hours to process  Encourage patient to contact the pharmacy for refills or they can request refills through [facility]  CLINICAL FILLS OUT ALL BELOW:   LAST REFILL:  QTY:  REFILL DATE:    OTHER COMMENTS:    Okay for refill?  Please advise

## 2020-12-19 ENCOUNTER — Telehealth: Payer: Self-pay | Admitting: Internal Medicine

## 2020-12-19 NOTE — Telephone Encounter (Signed)
Alexis Pierce [facility] and [facility] and she called [facility] and they told her that she would need a refill request from the dr so they can fill it and she is out of her medication and she didn't take it this morning and if she doesn't have it it will make her sick.   LAST APPOINTMENT DATE: 12/15/2020   NEXT APPOINTMENT DATE:@7 /13/2022  MEDICATION: ALPRAZolam (XANAX) 1 MG tablet  PHARMACY: [facility] [address]  Let patient know to contact pharmacy at the end of the day to make sure medication is ready.  Please notify patient to allow 48-72 hours to process  Encourage patient to contact the pharmacy for refills or they can request refills through [facility]  CLINICAL FILLS OUT ALL BELOW:   LAST REFILL:  QTY:  REFILL DATE:    OTHER COMMENTS:    Okay for refill?  Please advise

## 2020-12-19 NOTE — Telephone Encounter (Signed)
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 They will get it ready for her. I called pt back and let her know. She was aware they were out of stock. It was not that they did not have our rx, they did not have the actual medication in stock.

## 2020-12-27 ENCOUNTER — Telehealth: Payer: Self-pay

## 2020-12-27 ENCOUNTER — Ambulatory Visit: Payer: Self-pay | Admitting: Internal Medicine

## 2020-12-27 NOTE — Telephone Encounter (Signed)
Noted  

## 2020-12-27 NOTE — Telephone Encounter (Signed)
Left message for pt to let us know if she needed anything. I will cancel her appt.

## 2020-12-27 NOTE — Telephone Encounter (Signed)
Unable to reach pt by phone to see if wanted to change to video visit.

## 2020-12-27 NOTE — Telephone Encounter (Signed)
[facility] Night - Client Nonclinical Telephone Record AccessNurse Client [facility] Night - Client Client Site  [facility] [place] - Night Physician Tillman Abide- MD Contact Type Call Who Is Calling Patient / Member / Family / Caregiver Caller Name Ivana Nicastro Caller Phone Number [phone] Patient Name Alexis Pierce Patient DOB 1971/01/03 Call Type Message Only Information Provided Reason for Call Request to Cancel Office Appointment Initial Comment Caller states she needs to cancel her appt for today. She is vomiting and has headache. Patient request to speak to RN No Additional Comment Declined triage. Disp. Time Disposition Final User 12/27/2020 6:54:49 AM General Information Provided Yes Josephina Shih Call Closed By: Josephina Shih Transaction Date/Time: 12/27/2020 6:52:56 AM (ET)

## 2021-01-17 ENCOUNTER — Other Ambulatory Visit: Payer: Self-pay | Admitting: Internal Medicine

## 2021-01-17 NOTE — Telephone Encounter (Signed)
Last filled 06-13-21 #60 Last OV 10-26-20 Next OV 05-01-21 [facility]

## 2021-01-18 ENCOUNTER — Ambulatory Visit: Payer: Self-pay | Admitting: Internal Medicine

## 2021-02-14 ENCOUNTER — Other Ambulatory Visit: Payer: Self-pay | Admitting: Internal Medicine

## 2021-02-14 NOTE — Telephone Encounter (Signed)
Last OV 10/26/20 Last refill 01/17/21 #60/0 Next OV 05/01/21

## 2021-03-16 ENCOUNTER — Other Ambulatory Visit: Payer: Self-pay | Admitting: Internal Medicine

## 2021-03-16 ENCOUNTER — Telehealth: Payer: Self-pay | Admitting: Internal Medicine

## 2021-03-16 NOTE — Telephone Encounter (Signed)
Pt called in requesting a call back regarding medication concern #[phone]

## 2021-03-16 NOTE — Telephone Encounter (Signed)
Pt called to say they found her brother-in-law dead over the weekend. She says she took a few extra alprazolam and she is out. Last filled 02-15-21 #30

## 2021-03-16 NOTE — Telephone Encounter (Signed)
See refill request note

## 2021-04-11 ENCOUNTER — Emergency Department
Admission: EM | Admit: 2021-04-11 | Discharge: 2021-04-11 | Disposition: A | Discharge status: Home / Self Care | Payer: Self-pay | Attending: Student in an Organized Health Care Education/Training Program | Admitting: Student in an Organized Health Care Education/Training Program

## 2021-04-11 ENCOUNTER — Emergency Department: Payer: Self-pay

## 2021-04-11 ENCOUNTER — Other Ambulatory Visit: Payer: Self-pay

## 2021-04-11 ENCOUNTER — Telehealth: Payer: Self-pay

## 2021-04-11 DIAGNOSIS — R911 Solitary pulmonary nodule: Secondary | ICD-10-CM | POA: Insufficient documentation

## 2021-04-11 DIAGNOSIS — J069 Acute upper respiratory infection, unspecified: Secondary | ICD-10-CM | POA: Insufficient documentation

## 2021-04-11 DIAGNOSIS — J40 Bronchitis, not specified as acute or chronic: Secondary | ICD-10-CM | POA: Insufficient documentation

## 2021-04-11 DIAGNOSIS — Z20822 Contact with and (suspected) exposure to covid-19: Secondary | ICD-10-CM | POA: Insufficient documentation

## 2021-04-11 DIAGNOSIS — R7989 Other specified abnormal findings of blood chemistry: Secondary | ICD-10-CM | POA: Insufficient documentation

## 2021-04-11 LAB — CBC WITH DIFFERENTIAL/PLATELET
Abs Immature Granulocytes: 0.02 10*3/uL (ref 0.00–0.07)
Basophils Absolute: 0.1 10*3/uL (ref 0.0–0.1)
Basophils Relative: 1 %
Eosinophils Absolute: 0.3 10*3/uL (ref 0.0–0.5)
Eosinophils Relative: 3 %
HCT: 37.6 % (ref 36.0–46.0)
Hemoglobin: 13.1 g/dL (ref 12.0–15.0)
Immature Granulocytes: 0 %
Lymphocytes Relative: 19 %
Lymphs Abs: 1.9 10*3/uL (ref 0.7–4.0)
MCH: 31 pg (ref 26.0–34.0)
MCHC: 34.8 g/dL (ref 30.0–36.0)
MCV: 89.1 fL (ref 80.0–100.0)
Monocytes Absolute: 0.5 10*3/uL (ref 0.1–1.0)
Monocytes Relative: 6 %
Neutro Abs: 7.1 10*3/uL (ref 1.7–7.7)
Neutrophils Relative %: 71 %
Platelets: 310 10*3/uL (ref 150–400)
RBC: 4.22 MIL/uL (ref 3.87–5.11)
RDW: 12.8 % (ref 11.5–15.5)
WBC: 9.9 10*3/uL (ref 4.0–10.5)
nRBC: 0 % (ref 0.0–0.2)

## 2021-04-11 LAB — BASIC METABOLIC PANEL
Anion gap: 7 (ref 5–15)
BUN: 10 mg/dL (ref 6–20)
CO2: 26 mmol/L (ref 22–32)
Calcium: 9.9 mg/dL (ref 8.9–10.3)
Chloride: 107 mmol/L (ref 98–111)
Creatinine, Ser: 0.89 mg/dL (ref 0.44–1.00)
GFR, Estimated: >60 mL/min (ref >60)
Glucose, Bld: 95 mg/dL (ref 70–99)
Potassium: 4 mmol/L (ref 3.5–5.1)
Sodium: 140 mmol/L (ref 135–145)

## 2021-04-11 LAB — RESP PANEL BY RT-PCR (FLU A&B, COVID) ARPGX2
Influenza A by PCR: NEGATIVE
Influenza B by PCR: NEGATIVE
SARS Coronavirus 2 by RT PCR: NEGATIVE

## 2021-04-11 LAB — TROPONIN I (HIGH SENSITIVITY): Troponin I (High Sensitivity): <2 ng/L (ref <18)

## 2021-04-11 LAB — D-DIMER, QUANTITATIVE: D-Dimer, Quant: 0.73 ug/mL-FEU — ABNORMAL HIGH (ref 0.00–0.50)

## 2021-04-11 MED ORDER — GUAIFENESIN ER 600 MG PO TB12: 600.0000 mg | ORAL_TABLET | Freq: Two times a day (BID) | ORAL | 0 refills | Status: AC

## 2021-04-11 MED ORDER — BENZONATATE 100 MG PO CAPS: 100.0000 mg | ORAL_CAPSULE | Freq: Three times a day (TID) | ORAL | 0 refills | Status: AC | PRN

## 2021-04-11 MED ORDER — IBUPROFEN 400 MG PO TABS
400.0000 mg | ORAL_TABLET | Freq: Once | ORAL | Status: AC
  Administered 2021-04-11: 400 mg via ORAL
  Filled 2021-04-11: qty 1

## 2021-04-11 MED ORDER — IOHEXOL 350 MG/ML SOLN
75.0000 mL | Freq: Once | INTRAVENOUS | Status: AC | PRN
  Administered 2021-04-11: 75 mL via INTRAVENOUS
  Filled 2021-04-11: qty 75

## 2021-04-11 NOTE — Telephone Encounter (Signed)
[facility] Day - Client TELEPHONE ADVICE RECORD AccessNurse Patient Name: Alexis Pierce Gender: Female DOB: Nov 05, 1970 Age: 50 Y 2 M 29 D Return Phone Number: [phone] (Primary), [phone] (Secondary) Address: [place]/ [place]/ [place]  [address] Client [facility] Day - Client Client Site [facility] [place] - Day Physician Tillman Abide- MD Contact Type Call Who Is Calling Patient / Member / Family / Caregiver Call Type Triage / Clinical Relationship To Patient Self Return Phone Number [phone] (Primary) Chief Complaint BREATHING - shortness of breath or sounds breathless Reason for Call Symptomatic / Request for Health Information Initial Comment Caller states she has head congestion and chest congestion with SOB Translation No Nurse Assessment Nurse: Kizzie Bane, RN, Marylene Land Date/Time (Eastern Time): 04/11/2021 2:12:04 PM Confirm and document reason for call. If symptomatic, describe symptoms. ---Caller states she is having a lot of congestion for over a week. She has a cough and she has shortness of breath. She feels dizzy as well Does the patient have any new or worsening symptoms? ---Yes Will a triage be completed? ---Yes Related visit to physician within the last 2 weeks? ---No Does the PT have any chronic conditions? (i.e. diabetes, asthma, this includes High risk factors for pregnancy, etc.) ---No Is the patient pregnant or possibly pregnant? (Ask all females between the ages of 20-55) ---No Is this a behavioral health or substance abuse call? ---No Guidelines Guideline Title Affirmed Question Affirmed Notes Nurse Date/Time (Eastern Time) Breathing Difficulty [1] MILD difficulty breathing (e.g., minimal/no SOB at rest, SOB with walking, pulse <100) AND [2] NEW-onset or WORSE than normal [name], [name] 04/11/2021 2:13:54 PM PLEASE NOTE: All timestamps contained within  this report are represented as [place] Standard Time. CONFIDENTIALTY NOTICE: This fax transmission is intended only for the addressee. It contains information that is legally privileged, confidential or otherwise protected from use or disclosure. If you are not the intended recipient, you are strictly prohibited from reviewing, disclosing, copying using or disseminating any of this information or taking any action in reliance on or regarding this information. If you have received this fax in error, please notify us immediately by telephone so that we can arrange for its return to [place]. Phone: [phone], Toll-Free: [phone], Fax: [phone] Page: 2 of 2 Call Id: [id number] Disp. Time Lamount Cohen Time) Disposition Final User 04/11/2021 2:06:27 PM Send to Urgent Elyse Hsu 04/11/2021 2:19:29 PM See HCP within 4 Hours (or PCP triage) Yes Kizzie Bane, RN, Rosalyn Charters Disagree/Comply Comply Caller Understands Yes PreDisposition Did not know what to do Care Advice Given Per Guideline SEE HCP (OR PCP TRIAGE) WITHIN 4 HOURS: CALL BACK IF: * You become worse CARE ADVICE given per Breathing Difficulty (Adult) guideline. Referrals [facility]

## 2021-04-11 NOTE — Discharge Instructions (Addendum)
Your CT today showed: No evidence of pulmonary embolism.   No acute intrathoracic abnormalities.   3 mm RIGHT apex lung nodule; if patient is low risk for malignancy, no routine follow-up imaging is recommended; if patient is high risk for malignancy, a non-contrast Chest CT at 12 months is optional. If performed and the nodule is stable at 12 months, no further follow-up is recommended.   These guidelines do not apply to immunocompromised patients and patients with cancer. Follow up in patients with significant comorbidities as clinically warranted. For lung cancer screening, adhere to Lung-RADS guidelines. Reference: Radiology. 2017; 284(1):228-43.

## 2021-04-11 NOTE — ED Provider Notes (Signed)
Emergency Medicine Provider Triage Evaluation Note  Alexis Pierce , a 50 y.o. female  was evaluated in triage.  Pt complains of cough, chills, congestion, sinus pain, pressure, shortness of breath.  Symptoms x8 days.  Negative home COVID test  Review of Systems  Positive: Cough, chills, sinus pain, pressure, shortness of breath Negative: Chest pain, fevers  Physical Exam  BP 102/73 (BP Location: Left Arm)   Pulse 85   Temp 98.1 F (36.7 C) (Oral)   Resp 18   Ht 5\' 3"  (1.6 m)   Wt 70 kg   SpO2 97%   BMI 27.34 kg/m  Gen:   Awake, no distress   Resp:  Normal effort no wheezing MSK:   Moves extremities without difficulty  Other:    Medical Decision Making  Medically screening exam initiated at 4:21 PM.  Appropriate orders placed.  was informed that the remainder of the evaluation will be completed by another provider, this initial triage assessment does not replace that evaluation, and the importance of remaining in the ED until their evaluation is complete.  Will obtain flu COVID test and chest x-ray.   Alexis Bun, PA-C 04/11/21 1622    04/13/21, MD 04/11/21 04/13/21

## 2021-04-11 NOTE — ED Triage Notes (Signed)
Pt comes pov with SOB, congestion for about 8 days. Pt was sent by PCP by telephone. Hoarse voice. Had covid 3 months ago.

## 2021-04-11 NOTE — ED Notes (Signed)
Pt reporting a tingling/numbness feeling from head to her toes. Started about 10 min ago

## 2021-04-11 NOTE — ED Notes (Signed)
Dc instructions and scripts reviewed withpt. No questions or concernst at this time. Pt refused wheelchair and ambulated with steady gait out of department. Will follow up with pcp.

## 2021-04-11 NOTE — ED Provider Notes (Signed)
[facility] Emergency Department Provider Note  ____________________________________________   Event Date/Time   First MD Initiated Contact with Patient 04/11/21 1647     (approximate)  I have reviewed the triage vital signs and the nursing notes.   HISTORY  Chief Complaint Shortness of Breath   HPI Alexis Pierce is a 50 y.o. female with past medical history of insomnia, depression, anxiety, and vertigo who presents for assessment approximately 1 week of productive cough with yellow sputum, substernal is, shortness of breath, sore throat and some frontal headache is also intermittent episodes of lightheadedness.  Patient has been taking over-the-counter cold and flu medicines but does not feel this is helping much.  She denies any fevers states her ears have also been particularly achy but symmetric.  No significant abdominal pain, vomiting, diarrhea, burning with urination rash or extremity pain.  No recent falls or injuries.      Past Medical History:  Diagnosis Date   Anxiety 1990   Depression 2004   Insomnia    Suicidal intent 2004   pills   Syncope and collapse    VD (venereal disease)    x 2    Patient Active Problem List   Diagnosis Date Noted   Vertigo 12/29/2018   Colitis 01/15/2018   Diarrhea 01/15/2018   MDD (major depressive disorder), single episode, moderate (HCC) 03/20/2017   Preventative health care 01/18/2016   Sensory loss 01/18/2016   Allergic conjunctivitis 11/01/2015   CONTACT DERMATITIS 07/06/2008   Anxiety 12/04/2006   Mood disorder (HCC) 12/04/2006    Past Surgical History:  Procedure Laterality Date   ABDOMINAL HYSTERECTOMY  2005   endometriosis    Prior to Admission medications   Medication Sig Start Date End Date Taking? Authorizing Provider  benzonatate (TESSALON PERLES) 100 MG capsule Take 1 capsule (100 mg total) by mouth 3 (three) times daily as needed for up to 5 days for cough. 04/11/21  04/16/21 Yes Gilles Chiquito, MD  guaiFENesin (MUCINEX) 600 MG 12 hr tablet Take 1 tablet (600 mg total) by mouth 2 (two) times daily for 5 days. 04/11/21 04/16/21 Yes Gilles Chiquito, MD  ALPRAZolam [name]) 1 MG tablet TAKE 1/2 TO 1 TABLET BY MOUTH TWICE A DAY AS NEEDED FOR ANXIETY 03/16/21   Karie Schwalbe, MD  buPROPion [facility] SR) 150 MG 12 hr tablet Take 1 tablet (150 mg total) by mouth daily. 11/16/20   Karie Schwalbe, MD  FLUoxetine (PROZAC) 20 MG capsule TAKE ONE CAPSULE BY MOUTH ON ODD DAYS AND TAKE TWO CAPSULES BY MOUTH ON EVEN DAYS Patient taking differently: Take 20 mg by mouth daily. 06/28/20   Karie Schwalbe, MD  meclizine (ANTIVERT) 25 MG tablet Take 1-2 tablets (25-50 mg total) by mouth 3 (three) times daily as needed for dizziness. 12/29/18   Karie Schwalbe, MD  naphazoline (NAPHCON) 0.1 % ophthalmic solution Place 2 drops into both eyes 4 (four) times daily as needed for irritation. 11/01/15   Karie Schwalbe, MD  ondansetron (ZOFRAN) 4 MG tablet TAKE ONE TABLET BY MOUTH EVERY 8 HOURS AS NEEDED 09/18/20   Karie Schwalbe, MD    Allergies Ibuprofen, Penicillins, Hydrocodone-acetaminophen, and Oxycodone-acetaminophen  Family History  Problem Relation Age of Onset   Hypertension Mother    Stroke Mother        x 2   Obesity Mother    Cancer Father        bladder, throat, and prostate  Cancer Maternal Grandmother        lung    Social History Social History   Tobacco Use   Smoking status: Never   Smokeless tobacco: Never  Substance Use Topics   Alcohol use: No    Alcohol/week: 0.0 standard drinks   Drug use: No    Review of Systems  Review of Systems  Constitutional:  Positive for malaise/fatigue. Negative for chills and fever.  HENT:  Positive for congestion and sore throat.   Eyes:  Negative for pain.  Respiratory:  Positive for cough, sputum production and shortness of breath. Negative for stridor.   Cardiovascular:  Positive for chest pain.   Gastrointestinal:  Negative for vomiting.  Genitourinary:  Negative for dysuria.  Musculoskeletal:  Negative for myalgias.  Skin:  Negative for rash.  Neurological:  Positive for dizziness. Negative for seizures, loss of consciousness and headaches.  Psychiatric/Behavioral:  Negative for suicidal ideas.   All other systems reviewed and are negative.    ____________________________________________   PHYSICAL EXAM:  VITAL SIGNS: ED Triage Vitals  Enc Vitals Group     BP 04/11/21 1540 102/73     Pulse Rate 04/11/21 1540 85     Resp 04/11/21 1540 18     Temp 04/11/21 1540 98.1 F (36.7 C)     Temp Source 04/11/21 1540 Oral     SpO2 04/11/21 1540 97 %     Weight 04/11/21 1619 154 lb 5.2 oz (70 kg)     Height 04/11/21 1619 5\' 3"  (1.6 m)     Head Circumference --      Peak Flow --      Pain Score 04/11/21 1619 0     Pain Loc --      Pain Edu? --      Excl. in GC? --    Vitals:   04/11/21 1540  BP: 102/73  Pulse: 85  Resp: 18  Temp: 98.1 F (36.7 C)  SpO2: 97%   Physical Exam Vitals and nursing note reviewed.  Constitutional:      General: She is not in acute distress.    Appearance: She is well-developed.  HENT:     Head: Normocephalic and atraumatic.     Right Ear: External ear normal.     Left Ear: External ear normal.     Nose: Nose normal.     Mouth/Throat:     Pharynx: Posterior oropharyngeal erythema present.  Eyes:     Conjunctiva/sclera: Conjunctivae normal.  Cardiovascular:     Rate and Rhythm: Normal rate and regular rhythm.     Pulses: Normal pulses.     Heart sounds: No murmur heard. Pulmonary:     Effort: Pulmonary effort is normal. No respiratory distress.     Breath sounds: Normal breath sounds.  Abdominal:     Palpations: Abdomen is soft.     Tenderness: There is no abdominal tenderness.  Musculoskeletal:     Cervical back: Neck supple.  Skin:    General: Skin is warm and dry.     Capillary Refill: Capillary refill takes less than 2  seconds.  Neurological:     Mental Status: She is alert and oriented to person, place, and time.  Psychiatric:        Mood and Affect: Mood normal.     ____________________________________________   LABS (all labs ordered are listed, but only abnormal results are displayed)  Labs Reviewed  D-DIMER, QUANTITATIVE - Abnormal; Notable for the following components:  Result Value   D-Dimer, Quant 0.73 (*)    All other components within normal limits  RESP PANEL BY RT-PCR (FLU A&B, COVID) ARPGX2  CBC WITH DIFFERENTIAL/PLATELET  BASIC METABOLIC PANEL  TROPONIN I (HIGH SENSITIVITY)  TROPONIN I (HIGH SENSITIVITY)   ____________________________________________  EKG   ECG remarkable for sinus rhythm with a ventricular rate of 65, normal axis, unremarkable intervals without clear evidence of acute ischemia significant arrhythmia. ____________________________________________  RADIOLOGY  ED MD interpretation: Chest x-ray shows no focal consolidation, effusion, edema, pneumothorax or other clear acute process aside from some peribronchial thickening consistent with possible bronchitis.  CTA chest shows no evidence of PE or other acute process.  There is a 3 mm right apex lung nodule.  No other acute thoracic process.   Official radiology report(s): DG Chest 2 View  Result Date: 04/11/2021 CLINICAL DATA:  Cough EXAM: CHEST - 2 VIEW COMPARISON:  01/06/2018 FINDINGS: Mild bronchitic changes. No consolidation, pleural effusion or pneumothorax. Normal cardiomediastinal silhouette. No pneumothorax IMPRESSION: Bronchitic changes without focal airspace disease Electronically Signed   By: Jasmine Pang M.D.   On: 04/11/2021 16:55   CT Angio Chest PE W and/or Wo Contrast  Result Date: 04/11/2021 CLINICAL DATA:  Eight days EXAM: CT ANGIOGRAPHY CHEST WITH CONTRAST TECHNIQUE: Multidetector CT imaging of the chest was performed using the standard protocol during bolus administration of intravenous  contrast. Multiplanar CT image reconstructions and MIPs were obtained to evaluate the vascular anatomy. CONTRAST:  66mL OMNIPAQUE IOHEXOL 350 MG/ML SOLN IV COMPARISON:  None FINDINGS: Cardiovascular: Near normal caliber without aneurysm or dissection. Heart unremarkable. No pericardial effusion. Pulmonary arteries adequately opacified and patent. No evidence of pulmonary embolism. Mediastinum/Nodes: Esophagus unremarkable. No thoracic adenopathy. Base of cervical region normal appearance. Lungs/Pleura: 3 mm RIGHT apex lung nodule image 14. Lungs otherwise clear. No pulmonary infiltrate, pleural effusion, or pneumothorax. Few streak artifacts in the RIGHT upper lobe. Upper Abdomen: Visualized upper abdomen unremarkable. Musculoskeletal: No acute osseous findings. Review of the MIP images confirms the above findings. IMPRESSION: No evidence of pulmonary embolism. No acute intrathoracic abnormalities. 3 mm RIGHT apex lung nodule; if patient is low risk for malignancy, no routine follow-up imaging is recommended; if patient is high risk for malignancy, a non-contrast Chest CT at 12 months is optional. If performed and the nodule is stable at 12 months, no further follow-up is recommended. These guidelines do not apply to immunocompromised patients and patients with cancer. Follow up in patients with significant comorbidities as clinically warranted. For lung cancer screening, adhere to Lung-RADS guidelines. Reference: Radiology. 2017; 284(1):228-43. Electronically Signed   By: Ulyses Southward M.D.   On: 04/11/2021 18:29    ____________________________________________   PROCEDURES  Procedure(s) performed (including Critical Care):  Procedures   ____________________________________________   INITIAL IMPRESSION / ASSESSMENT AND PLAN / ED COURSE      Patient presents for assessment of approximately a week of cough, shortness of breath, chest pain, frontal headache and congestion as well as a mild sore throat.   On arrival she is afebrile hemodynamically stable.  She has some mild posterior oropharyngeal erythema without uvular deviation, tonsillar enlargement, exudates.  TMs are unremarkable bilaterally patient has forage motion of her neck.  Suspect likely viral sinusitis and bronchitis with some pharyngitis as well.  There is no evidence of otitis media or other deep space infection head or neck and patient did not appear septic or meningitic.  Chest x-ray shows no focal consolidation, effusion, edema, pneumothorax or other clear acute  process aside from some peribronchial thickening consistent with possible bronchitis.  Given CBC shows no leukocytosis and she has no fever I have a low suspicion for bacterial pneumonia.  No acute anemia explain patient shortness of breath on CBC.  BMP is unremarkable for significant electrolyte or metabolic derangements.  D-dimer is elevated 0.73.  This obtained a CTA to rule out a PE.  CTA chest shows no evidence of PE or other acute process.  There is a 3 mm right apex lung nodule.  No other acute thoracic process.    ECG remarkable for sinus rhythm with a ventricular rate of 65, normal axis, unremarkable intervals without clear evidence of acute ischemia significant arrhythmia.  Given nonelevated troponin I have a low suspicion for ACS or myocarditis.  Suspect symptoms are related to likely viral URI.  Write Rx for [employer] and Mucinex.  There is no evidence of significant increased work of breathing and respiratory failure and have a low suspicion for other immediate life-threatening process at this time.  Patient discharged stable condition.  Strict return precautions advised and discussed.         ____________________________________________   FINAL CLINICAL IMPRESSION(S) / ED DIAGNOSES  Final diagnoses:  Bronchitis  Viral URI  Positive D dimer  Lung nodule    Medications  ibuprofen (ADVIL) tablet 400 mg (400 mg Oral Given 04/11/21 1703)  iohexol  (OMNIPAQUE) 350 MG/ML injection 75 mL (75 mLs Intravenous Contrast Given 04/11/21 1743)     ED Discharge Orders          Ordered    guaiFENesin (MUCINEX) 600 MG 12 hr tablet  2 times daily        04/11/21 1842    benzonatate (TESSALON PERLES) 100 MG capsule  3 times daily PRN        04/11/21 1842             Note:  This document was prepared using Dragon voice recognition software and may include unintentional dictation errors.    Gilles Chiquito, MD 04/11/21 [phone]

## 2021-04-11 NOTE — Telephone Encounter (Signed)
Per chart review tab pt is presently at [facility] ED. Sending note to Dr Alphonsus Sias who is out of office and Dr Milinda Antis who is in office and [facility] CMA.

## 2021-04-12 MED ORDER — ALBUTEROL SULFATE HFA 108 (90 BASE) MCG/ACT IN AERS
2.0000 | INHALATION_SPRAY | Freq: Four times a day (QID) | RESPIRATORY_TRACT | 0 refills | Status: DC | PRN
Start: 2021-04-12 — End: 2022-11-06

## 2021-04-12 NOTE — Telephone Encounter (Signed)
Let her know I sent the Rx 

## 2021-04-12 NOTE — Addendum Note (Signed)
Addended by: Tillman Abide I on: 04/12/2021 04:57 PM   Modules accepted: Orders

## 2021-04-12 NOTE — Telephone Encounter (Signed)
Left message on VM for pt that [facility] and to use a spacer.

## 2021-04-12 NOTE — Telephone Encounter (Signed)
Spoke to pt. She is still having shortness of breath when she is moving around or starts talking. Asking if an inhaler may work. Send to [facility].

## 2021-04-13 ENCOUNTER — Other Ambulatory Visit: Payer: Self-pay | Admitting: Internal Medicine

## 2021-04-13 NOTE — Telephone Encounter (Signed)
Last filled 03-16-21 #60 Last OV 10/26/20 Next OV 05/01/21 [facility]

## 2021-04-17 ENCOUNTER — Telehealth: Payer: Self-pay | Admitting: Internal Medicine

## 2021-04-17 NOTE — Telephone Encounter (Signed)
I just have had the chance to call the pt back. She said she had told the person on the phone that she was not doing any better after finishing all other medication and wondered if Dr Alphonsus Sias would call in something else. Pt ended up calling back about an hour ago and was put on Matt's schedule.I apologized that it was relayed to me that this was a more urgent matter.

## 2021-04-17 NOTE — Telephone Encounter (Signed)
Pt would like for you to give her a call

## 2021-04-18 ENCOUNTER — Encounter: Payer: Self-pay | Admitting: Nurse Practitioner

## 2021-04-18 ENCOUNTER — Other Ambulatory Visit: Payer: Self-pay

## 2021-04-18 ENCOUNTER — Telehealth (INDEPENDENT_AMBULATORY_CARE_PROVIDER_SITE_OTHER): Payer: Self-pay | Admitting: Nurse Practitioner

## 2021-04-18 VITALS — Ht 63.0 in | Wt 155.0 lb

## 2021-04-18 DIAGNOSIS — J22 Unspecified acute lower respiratory infection: Secondary | ICD-10-CM | POA: Insufficient documentation

## 2021-04-18 DIAGNOSIS — R0602 Shortness of breath: Secondary | ICD-10-CM | POA: Insufficient documentation

## 2021-04-18 MED ORDER — PREDNISONE 20 MG PO TABS: ORAL_TABLET | ORAL | 0 refills | Status: AC

## 2021-04-18 MED ORDER — DOXYCYCLINE HYCLATE 100 MG PO TABS: 100.0000 mg | ORAL_TABLET | Freq: Two times a day (BID) | ORAL | 0 refills | Status: DC

## 2021-04-18 NOTE — Progress Notes (Signed)
Patient ID: Alexis Pierce, female    DOB: [birth year], 50 y.o.   MRN: [id number]  Virtual visit completed through [employer], a video enabled telemedicine application. Due to national recommendations of social distancing due to COVID-19, a virtual visit is felt to be most appropriate for this patient at this time. Reviewed limitations, risks, security and privacy concerns of performing a virtual visit and the availability of in person appointments. I also reviewed that there may be a patient responsible charge related to this service. The patient agreed to proceed.   Patient location: home Provider location: [place] [facility], office Persons participating in this virtual visit: patient, provider   If any vitals were documented, they were collected by patient at home unless specified below.    Ht 5\' 3"  (1.6 m)   Wt 155 lb (70.3 kg)   BMI 27.46 kg/m    CC: Chest congestion  Subjective:   HPI: Alexis Pierce is a 50 y.o. female presenting on 04/18/2021 for chest congestion, labored breathing (Has a hard time breathing when talking. ), Cough, and Dizziness (When she looks up she gets dizzy. )    Symptoms started 3 weeks ago She has been tested for covid at the ED 04/11/2021 Not vaccinated against covid Has been taking mucinex and tessalon perales. No help Has used albuterol inhaler without great rel      Relevant past medical, surgical, family and social history reviewed and updated as indicated. Interim medical history since our last visit reviewed. Allergies and medications reviewed and updated. Outpatient Medications Prior to Visit  Medication Sig Dispense Refill   albuterol (VENTOLIN HFA) 108 (90 Base) MCG/ACT inhaler Inhale 2 puffs into the lungs every 6 (six) hours as needed for wheezing or shortness of breath. Use with spacer 18 g 0   ALPRAZolam (XANAX) 1 MG tablet TAKE 1/2 TO 1 TABLET BY MOUTH TWICE A DAY AS NEEDED FOR ANXIETY 60 tablet 0   buPROPion (WELLBUTRIN  SR) 150 MG 12 hr tablet Take 1 tablet (150 mg total) by mouth daily. 30 tablet 3   FLUoxetine (PROZAC) 20 MG capsule TAKE ONE CAPSULE BY MOUTH ON ODD DAYS AND TAKE TWO CAPSULES BY MOUTH ON EVEN DAYS (Patient taking differently: Take 20 mg by mouth daily.) 90 capsule 3   meclizine (ANTIVERT) 25 MG tablet Take 1-2 tablets (25-50 mg total) by mouth 3 (three) times daily as needed for dizziness. 60 tablet 3   naphazoline (NAPHCON) 0.1 % ophthalmic solution Place 2 drops into both eyes 4 (four) times daily as needed for irritation. 15 mL 3   ondansetron (ZOFRAN) 4 MG tablet TAKE ONE TABLET BY MOUTH EVERY 8 HOURS AS NEEDED 20 tablet 0   No facility-administered medications prior to visit.     Per HPI unless specifically indicated in ROS section below Review of Systems  Constitutional:  Negative for chills and fever.  HENT:  Positive for congestion, sinus pressure and voice change. Negative for sore throat.   Respiratory:  Positive for cough (yellowish color) and shortness of breath (DOE).   Cardiovascular:  Positive for chest pain (tightness).  Gastrointestinal:  Negative for abdominal pain, diarrhea, nausea and vomiting.  Musculoskeletal:  Negative for arthralgias and myalgias.  Neurological:  Positive for dizziness and headaches. Negative for weakness and numbness.  Objective:  Ht 5\' 3"  (1.6 m)   Wt 155 lb (70.3 kg)   BMI 27.46 kg/m   Wt Readings from Last 3 Encounters:  04/18/21 155 lb (70.3  kg)  04/11/21 154 lb 5.2 oz (70 kg)  10/26/20 155 lb (70.3 kg)       Physical exam: Gen: alert, NAD, not ill appearing Pulm: speaks in complete sentences without increased work of breathing Psych: normal mood, normal thought content      Results for orders placed or performed during the hospital encounter of 04/11/21  Resp Panel by RT-PCR (Flu A&B, Covid) Nasopharyngeal Swab   Specimen: Nasopharyngeal Swab; Nasopharyngeal(NP) swabs in vial transport medium  Result Value Ref Range   SARS  Coronavirus 2 by RT PCR NEGATIVE NEGATIVE   Influenza A by PCR NEGATIVE NEGATIVE   Influenza B by PCR NEGATIVE NEGATIVE  D-dimer, quantitative  Result Value Ref Range   D-Dimer, Quant 0.73 (H) 0.00 - 0.50 ug/mL-FEU  CBC with Differential  Result Value Ref Range   WBC 9.9 4.0 - 10.5 K/uL   RBC 4.22 3.87 - 5.11 MIL/uL   Hemoglobin 13.1 12.0 - 15.0 g/dL   HCT 77.1 16.5 - 79.0 %   MCV 89.1 80.0 - 100.0 fL   MCH 31.0 26.0 - 34.0 pg   MCHC 34.8 30.0 - 36.0 g/dL   RDW 38.3 33.8 - 32.9 %   Platelets 310 150 - 400 K/uL   nRBC 0.0 0.0 - 0.2 %   Neutrophils Relative % 71 %   Neutro Abs 7.1 1.7 - 7.7 K/uL   Lymphocytes Relative 19 %   Lymphs Abs 1.9 0.7 - 4.0 K/uL   Monocytes Relative 6 %   Monocytes Absolute 0.5 0.1 - 1.0 K/uL   Eosinophils Relative 3 %   Eosinophils Absolute 0.3 0.0 - 0.5 K/uL   Basophils Relative 1 %   Basophils Absolute 0.1 0.0 - 0.1 K/uL   Immature Granulocytes 0 %   Abs Immature Granulocytes 0.02 0.00 - 0.07 K/uL  Basic metabolic panel  Result Value Ref Range   Sodium 140 135 - 145 mmol/L   Potassium 4.0 3.5 - 5.1 mmol/L   Chloride 107 98 - 111 mmol/L   CO2 26 22 - 32 mmol/L   Glucose, Bld 95 70 - 99 mg/dL   Pierce 10 6 - 20 mg/dL   Creatinine, Ser 1.91 0.44 - 1.00 mg/dL   Calcium 9.9 8.9 - 66.0 mg/dL   GFR, Estimated >60 >04 mL/min   Anion gap 7 5 - 15  Troponin I (High Sensitivity)  Result Value Ref Range   Troponin I (High Sensitivity) <2 <18 ng/L   Assessment & Plan:   Problem List Items Addressed This Visit       Respiratory   Lower respiratory infection (e.g., bronchitis, pneumonia, pneumonitis, pulmonitis) - Primary    Given length of symptoms going on for approximately 3 weeks concern for it moving to a lower respiratory tract infection.  Discussed this with patient discussed options decided to start doxycycline 100 mg twice daily for the next 7 days.  Discussed if patient's not feeling better in the next 3 to 4 days she needs to return to the  office and likely be seen in person.  Patient acknowledged.  Also discussed signs and symptoms when to be seen urgently or emergently.  Patient acknowledged. Start doxycycline 100 mg twice daily for 7 days.  Also discussed limitations in regards to video visits and what we can see and handle via video.      Relevant Medications   doxycycline (VIBRA-TABS) 100 MG tablet     Other   Shortness of breath    Patient  having some dyspnea on exertion.  He is albuterol without much relief.  She was recently evaluated in the emergency department and they ruled her out for PE.  We will start patient on a short course of steroids.  Precautions given with use and avoiding NSAIDs.  Did discuss signs symptoms when patient is to be seen or evaluated in urgent care or emergency department setting.  Patient acknowledged.      Relevant Medications   predniSONE (DELTASONE) 20 MG tablet     No orders of the defined types were placed in this encounter.  No orders of the defined types were placed in this encounter.   I discussed the assessment and treatment plan with the patient. The patient was provided an opportunity to ask questions and all were answered. The patient agreed with the plan and demonstrated an understanding of the instructions. The patient was advised to call back or seek an in-person evaluation if the symptoms worsen or if the condition fails to improve as anticipated.  Follow up plan: No follow-ups on file.  Audria Nine, NP

## 2021-04-18 NOTE — Assessment & Plan Note (Signed)
Patient having some dyspnea on exertion.  He is albuterol without much relief.  She was recently evaluated in the emergency department and they ruled her out for PE.  We will start patient on a short course of steroids.  Precautions given with use and avoiding NSAIDs.  Did discuss signs symptoms when patient is to be seen or evaluated in urgent care or emergency department setting.  Patient acknowledged.

## 2021-04-18 NOTE — Assessment & Plan Note (Signed)
Given length of symptoms going on for approximately 3 weeks concern for it moving to a lower respiratory tract infection.  Discussed this with patient discussed options decided to start doxycycline 100 mg twice daily for the next 7 days.  Discussed if patient's not feeling better in the next 3 to 4 days she needs to return to the office and likely be seen in person.  Patient acknowledged.  Also discussed signs and symptoms when to be seen urgently or emergently.  Patient acknowledged. Start doxycycline 100 mg twice daily for 7 days.  Also discussed limitations in regards to video visits and what we can see and handle via video.

## 2021-05-01 ENCOUNTER — Encounter: Payer: Self-pay | Admitting: Internal Medicine

## 2021-05-01 ENCOUNTER — Ambulatory Visit (INDEPENDENT_AMBULATORY_CARE_PROVIDER_SITE_OTHER): Payer: Self-pay | Admitting: Internal Medicine

## 2021-05-01 ENCOUNTER — Other Ambulatory Visit: Payer: Self-pay

## 2021-05-01 VITALS — BP 116/78 | HR 74 | Temp 97.9°F | Ht 63.0 in | Wt 142.0 lb

## 2021-05-01 DIAGNOSIS — Z1211 Encounter for screening for malignant neoplasm of colon: Secondary | ICD-10-CM

## 2021-05-01 DIAGNOSIS — F39 Unspecified mood [affective] disorder: Secondary | ICD-10-CM

## 2021-05-01 NOTE — Assessment & Plan Note (Signed)
Holding up under severe stress really well Will continue the fluoxetine Has used the xanax more---can refill it next week before the holiday though

## 2021-05-01 NOTE — Patient Instructions (Signed)
Please check healthcare.gov to check about insurance on the exchange

## 2021-05-01 NOTE — Progress Notes (Signed)
Subjective:    Patient ID: Alexis Pierce, female    DOB: May 12, 1971, 50 y.o.   MRN: [id number]  HPI Here for follow up of depression This visit occurred during the SARS-CoV-2 public health emergency.  Safety protocols were in place, including screening questions prior to the visit, additional usage of staff PPE, and extensive cleaning of exam room while observing appropriate contact time as indicated for disinfecting solutions.   "I feel like I am in a tornado" BIL just died---husband in a funk about this Having to take care of people Mother found unresponsive--now in rehab. Has had gradual memory loss and still lives alone. Got dehydrated, not eating right. ?delirium  Very stressed "I don't know how I am keeping my mind together" Still on the fluoxetine Has needed some extra xanax with all this stress  Same job--hours more reasonable now Now 11-6PM daily 5 days per week No benefits  Current Outpatient Medications on File Prior to Visit  Medication Sig Dispense Refill   albuterol (VENTOLIN HFA) 108 (90 Base) MCG/ACT inhaler Inhale 2 puffs into the lungs every 6 (six) hours as needed for wheezing or shortness of breath. Use with spacer 18 g 0   ALPRAZolam (XANAX) 1 MG tablet TAKE 1/2 TO 1 TABLET BY MOUTH TWICE A DAY AS NEEDED FOR ANXIETY 60 tablet 0   FLUoxetine (PROZAC) 20 MG capsule TAKE ONE CAPSULE BY MOUTH ON ODD DAYS AND TAKE TWO CAPSULES BY MOUTH ON EVEN DAYS (Patient taking differently: Take 20 mg by mouth daily.) 90 capsule 3   meclizine (ANTIVERT) 25 MG tablet Take 1-2 tablets (25-50 mg total) by mouth 3 (three) times daily as needed for dizziness. 60 tablet 3   naphazoline (NAPHCON) 0.1 % ophthalmic solution Place 2 drops into both eyes 4 (four) times daily as needed for irritation. 15 mL 3   ondansetron (ZOFRAN) 4 MG tablet TAKE ONE TABLET BY MOUTH EVERY 8 HOURS AS NEEDED 20 tablet 0   No current facility-administered medications on file prior to visit.    Allergies   Allergen Reactions   Ibuprofen Nausea And Vomiting    Higher Doses 600mg  or more   Penicillins     Shaking, vomiting   Hydrocodone-Acetaminophen     REACTION: nausea   Oxycodone-Acetaminophen     REACTION: nausea    Past Medical History:  Diagnosis Date   Anxiety 1990   Depression 2004   Insomnia    Suicidal intent 2004   pills   Syncope and collapse    VD (venereal disease)    x 2    Past Surgical History:  Procedure Laterality Date   ABDOMINAL HYSTERECTOMY  2005   endometriosis    Family History  Problem Relation Age of Onset   Hypertension Mother    Stroke Mother        x 2   Obesity Mother    Cancer Father        bladder, throat, and prostate   Cancer Maternal Grandmother        lung    Social History   Socioeconomic History   Marital status: Married    Spouse name: Not on file   Number of children: 2   Years of education: Not on file   Highest education level: Not on file  Occupational History   Occupation: 2006    Comment:    Tobacco Use   Smoking status: Never   Smokeless tobacco: Never  Substance and Sexual Activity  Alcohol use: No    Alcohol/week: 0.0 standard drinks   Drug use: No   Sexual activity: Not on file  Other Topics Concern   Not on file  Social History Narrative   Patient signed designated party release form and gives Ghazal Pevey (spouse) [phone] access to medical records.   Social Determinants of Health   Financial Resource Strain: Not on file  Food Insecurity: Not on file  Transportation Needs: Not on file  Physical Activity: Not on file  Stress: Not on file  Social Connections: Not on file  Intimate Partner Violence: Not on file   Review of Systems Sleeps okay mostly (if phone quiet) Appetite is good--she cut out all sugars, drinking just water/coffee Has lost ~13#    Objective:   Physical Exam Constitutional:      Appearance: Normal appearance.  Neurological:     Mental Status: She  is alert.  Psychiatric:        Mood and Affect: Mood normal.        Behavior: Behavior normal.     Comments: Calm and controlled!!           Assessment & Plan:

## 2021-05-03 ENCOUNTER — Other Ambulatory Visit (INDEPENDENT_AMBULATORY_CARE_PROVIDER_SITE_OTHER): Payer: Self-pay

## 2021-05-03 ENCOUNTER — Telehealth: Payer: Self-pay | Admitting: Radiology

## 2021-05-03 DIAGNOSIS — Z1211 Encounter for screening for malignant neoplasm of colon: Secondary | ICD-10-CM

## 2021-05-03 LAB — FECAL OCCULT BLOOD, IMMUNOCHEMICAL: Fecal Occult Bld: POSITIVE — AB

## 2021-05-03 NOTE — Telephone Encounter (Signed)
[facility] called a positive ifob, results sent to Dr Letvak and given to Dr Duncan (inhouse). 

## 2021-05-03 NOTE — Telephone Encounter (Signed)
Results released Will talk to her about setting up colonoscopy--but it is an issue since she has no insurance.

## 2021-05-04 NOTE — Telephone Encounter (Signed)
I will defer to PCP

## 2021-05-07 ENCOUNTER — Other Ambulatory Visit: Payer: Self-pay | Admitting: Internal Medicine

## 2021-05-07 NOTE — Telephone Encounter (Signed)
Last filled 04-13-21 #60 Last OV 05-01-21 (stated then she had taken extra due to family situations) Next OV 10-30-21 [facility]

## 2021-05-08 ENCOUNTER — Telehealth: Payer: Self-pay

## 2021-05-08 DIAGNOSIS — R195 Other fecal abnormalities: Secondary | ICD-10-CM

## 2021-05-08 NOTE — Telephone Encounter (Signed)
Positive iFOB test: Blood in stool  Per Dr Alphonsus Sias: Please call her to check about where she would like to go for a colonoscopy.  I have heard there is a place in [place] with a lower facility fee--but I am not sure if they are certified.  [place] does discount self pay patients---may be best to go for the pre visit and see what they say about cost    Left message for pt to call office

## 2021-05-08 NOTE — Progress Notes (Signed)
Left message for pt to call back.Created TE.

## 2021-05-09 NOTE — Telephone Encounter (Signed)
Pt returned call . Would like a call back #[phone]

## 2021-05-09 NOTE — Addendum Note (Signed)
Addended by: Tillman Abide I on: 05/09/2021 03:05 PM   Modules accepted: Orders

## 2021-05-09 NOTE — Telephone Encounter (Signed)
Spoke to pt. She said [place] will be fine. Please set up with [facility] so she can find out about Financial Aid. She is aware it will be next week before anything is started.

## 2021-06-11 ENCOUNTER — Other Ambulatory Visit: Payer: Self-pay | Admitting: Internal Medicine

## 2021-06-12 NOTE — Telephone Encounter (Signed)
Last filled 05/07/21 #60 with 0 refill Last OV 05-01-21 (stated then she had taken extra due to family situations) Next OV 10-30-21 [facility]

## 2021-06-12 NOTE — Telephone Encounter (Signed)
Patient has called wanting an update on the medication; informed her to give the provider some time since the request is still pending as well as returning back from [place].

## 2021-06-13 NOTE — Telephone Encounter (Signed)
Pt called to follow up on rx refill.

## 2021-06-19 ENCOUNTER — Other Ambulatory Visit: Payer: Self-pay | Admitting: Internal Medicine

## 2021-07-13 ENCOUNTER — Other Ambulatory Visit: Payer: Self-pay | Admitting: Internal Medicine

## 2021-07-13 NOTE — Telephone Encounter (Signed)
Last Filled 06-13-21 #60 Last OV 05-01-21 Next OV 10-30-21 [facility]

## 2021-07-20 ENCOUNTER — Other Ambulatory Visit: Payer: Self-pay | Admitting: Internal Medicine

## 2021-07-20 MED ORDER — ONDANSETRON HCL 4 MG PO TABS: 4.0000 mg | ORAL_TABLET | Freq: Three times a day (TID) | ORAL | 1 refills | Status: DC | PRN

## 2021-07-20 NOTE — Addendum Note (Signed)
Addended by: Eual Fines on: 07/20/2021 02:36 PM   Modules accepted: Orders

## 2021-07-31 ENCOUNTER — Telehealth: Payer: Self-pay | Admitting: Internal Medicine

## 2021-07-31 NOTE — Telephone Encounter (Signed)
Spoke to pt. Made appt at 1230 tomorrow.

## 2021-07-31 NOTE — Telephone Encounter (Signed)
Spoke to pt. She said she feels like she could be having a breakdown. Was starting to get tearful at times. 1st appt in the office with anyone is in 2 days with [facility]. Will forward to Dr Alphonsus Sias to see if he wants to add her on this week.

## 2021-07-31 NOTE — Telephone Encounter (Signed)
Alexis Pierce called in and stated that she on Friday night she woke up with a sharp pain in her head and was throwing up for 3hrs. And then she stated that she feels like she is in a fog and cant remember things, ex: she was cleaning her watch with alcohol and she was thirsty and went picked up the bottle of alcohol  drink instead of the water bottle, and its been like this for the past couple of weeks

## 2021-08-01 ENCOUNTER — Ambulatory Visit (INDEPENDENT_AMBULATORY_CARE_PROVIDER_SITE_OTHER): Payer: Self-pay | Admitting: Internal Medicine

## 2021-08-01 ENCOUNTER — Other Ambulatory Visit: Payer: Self-pay

## 2021-08-01 ENCOUNTER — Encounter: Payer: Self-pay | Admitting: Internal Medicine

## 2021-08-01 VITALS — BP 114/78 | HR 69 | Temp 96.9°F | Ht 63.0 in | Wt 132.0 lb

## 2021-08-01 DIAGNOSIS — F321 Major depressive disorder, single episode, moderate: Secondary | ICD-10-CM

## 2021-08-01 MED ORDER — ARIPIPRAZOLE 5 MG PO TABS: 5.0000 mg | ORAL_TABLET | Freq: Every day | ORAL | 3 refills | Status: DC

## 2021-08-01 MED ORDER — ONDANSETRON HCL 4 MG PO TABS: 4.0000 mg | ORAL_TABLET | Freq: Three times a day (TID) | ORAL | 1 refills | Status: DC | PRN

## 2021-08-01 NOTE — Progress Notes (Signed)
° °  Subjective:    Patient ID: Alexis Pierce, female    DOB: 07/24/1970, 51 y.o.   MRN: [id number]  HPI Extensive note deleted by Epic  She is having memory, mood and multiple somatic complaints Very severe   Review of Systems     Objective:   Physical Exam Constitutional:      Appearance: Normal appearance.  Cardiovascular:     Rate and Rhythm: Normal rate and regular rhythm.     Heart sounds: No murmur heard.   No gallop.  Pulmonary:     Effort: Pulmonary effort is normal.     Breath sounds: Normal breath sounds. No wheezing or rales.  Abdominal:     Palpations: Abdomen is soft.     Comments: Mild generalized tenderness  Musculoskeletal:     Comments: No spine tenderness SLR negative Hips normal  Neurological:     Mental Status: She is alert.  Psychiatric:     Comments: Anxious, upset, flustered Some depression           Assessment & Plan:

## 2021-08-01 NOTE — Assessment & Plan Note (Signed)
Worse with panic, anxiety and bipolar edge Will restart the abilify 5mg  daily Be careful with eating See back in 2-4 weeks

## 2021-08-10 ENCOUNTER — Encounter: Payer: Self-pay | Admitting: Gastroenterology

## 2021-08-10 ENCOUNTER — Other Ambulatory Visit: Payer: Self-pay | Admitting: Internal Medicine

## 2021-08-10 NOTE — Telephone Encounter (Signed)
Last filled 07-13-21 #60 Last OV 08-01-21 Next OV 08-29-21 [facility]

## 2021-08-24 ENCOUNTER — Ambulatory Visit: Payer: Self-pay | Admitting: Gastroenterology

## 2021-08-29 ENCOUNTER — Telehealth: Payer: Self-pay | Admitting: Internal Medicine

## 2021-08-29 ENCOUNTER — Ambulatory Visit: Payer: Self-pay | Admitting: Internal Medicine

## 2021-08-29 NOTE — Telephone Encounter (Signed)
Pt called to cancel appt. Pt states that she is doing 100% better but if you feel she still needs to be seen she will make another appt. Please advise. ?

## 2021-08-29 NOTE — Telephone Encounter (Signed)
I canceled the appt earlier this morning. ?

## 2021-09-07 ENCOUNTER — Other Ambulatory Visit: Payer: Self-pay | Admitting: Internal Medicine

## 2021-09-07 NOTE — Telephone Encounter (Signed)
Last filled 08-10-21 #60 ?Last OV 08-01-21 ?Next OV 09-11-21 ?[facility] ?

## 2021-09-11 ENCOUNTER — Ambulatory Visit (INDEPENDENT_AMBULATORY_CARE_PROVIDER_SITE_OTHER): Payer: Self-pay | Admitting: Internal Medicine

## 2021-09-11 ENCOUNTER — Encounter: Payer: Self-pay | Admitting: Internal Medicine

## 2021-09-11 ENCOUNTER — Other Ambulatory Visit: Payer: Self-pay

## 2021-09-11 DIAGNOSIS — F319 Bipolar disorder, unspecified: Secondary | ICD-10-CM

## 2021-09-11 MED ORDER — ARIPIPRAZOLE 10 MG PO TABS: 10.0000 mg | ORAL_TABLET | Freq: Every day | ORAL | 5 refills | Status: DC

## 2021-09-11 NOTE — Patient Instructions (Signed)
Please increase the aripiprazole to 10mg  daily. Let me know if you are not feeling some better within 1-2 weeks. ?

## 2021-09-11 NOTE — Assessment & Plan Note (Addendum)
Her irritability and feelings indicate bipolar instead of unipolar MDD ?Did respond to abilify but the effect wore off---discussed need to increase the dose ?Fluoxetine not the best choice with this---but she seems to have done okay at lower doses. Did not do well on wellbutrin in the past ?

## 2021-09-11 NOTE — Progress Notes (Signed)
? ?Subjective:  ? ? Patient ID: Alexis Pierce, female    DOB: [birth year], 51 y.o.   MRN: [id number] ? ?HPI ?Here for follow up of severe exacerbation of MDD ? ?She did feel better for a few weeks ?Then last week, things started to get bad again ?"I feel like I have too much on me, like I am going to come out of my skin" ?Somatic symptoms again---"like I have an elephant on my shoulders and chest" ?Anxiety is "so crazy I feel like I am going to fly" ? ?Eating about the same ?Keeping with diet she has been working on ? ?Counseling has never helped her ?Does go to church ? ?Current Outpatient Medications on File Prior to Visit  ?Medication Sig Dispense Refill  ? albuterol (VENTOLIN HFA) 108 (90 Base) MCG/ACT inhaler Inhale 2 puffs into the lungs every 6 (six) hours as needed for wheezing or shortness of breath. Use with spacer 18 g 0  ? ALPRAZolam (XANAX) 1 MG tablet TAKE 1/2 TO 1 TABLET BY MOUTH TWICE A DAY AS NEEDED FOR ANXIETY 60 tablet 0  ? ARIPiprazole (ABILIFY) 5 MG tablet Take 1 tablet (5 mg total) by mouth daily. 30 tablet 3  ? FLUoxetine (PROZAC) 20 MG capsule Take 1 capsule (20 mg total) by mouth daily. 90 capsule 1  ? meclizine (ANTIVERT) 25 MG tablet Take 1-2 tablets (25-50 mg total) by mouth 3 (three) times daily as needed for dizziness. 60 tablet 3  ? naphazoline (NAPHCON) 0.1 % ophthalmic solution Place 2 drops into both eyes 4 (four) times daily as needed for irritation. 15 mL 3  ? ondansetron (ZOFRAN) 4 MG tablet Take 1 tablet (4 mg total) by mouth every 8 (eight) hours as needed. 30 tablet 1  ? ?No current facility-administered medications on file prior to visit.  ? ? ?Allergies  ?Allergen Reactions  ? Ibuprofen Nausea And Vomiting  ?  Higher Doses 600mg  or more  ? Penicillins   ?  Shaking, vomiting  ? Hydrocodone-Acetaminophen   ?  REACTION: nausea  ? Oxycodone-Acetaminophen   ?  REACTION: nausea  ? ? ?Past Medical History:  ?Diagnosis Date  ? Anxiety 1990  ? Depression 2004  ? Insomnia   ?  Suicidal intent 2004  ? pills  ? Syncope and collapse   ? VD (venereal disease)   ? x 2  ? ? ?Past Surgical History:  ?Procedure Laterality Date  ? ABDOMINAL HYSTERECTOMY  2005  ? endometriosis  ? ? ?Family History  ?Problem Relation Age of Onset  ? Hypertension Mother   ? Stroke Mother   ?     x 2  ? Obesity Mother   ? Cancer Father   ?     bladder, throat, and prostate  ? Cancer Maternal Grandmother   ?     lung  ? ? ?Social History  ? ?Socioeconomic History  ? Marital status: Married  ?  Spouse name: Not on file  ? Number of children: 2  ? Years of education: Not on file  ? Highest education level: Not on file  ?Occupational History  ? Occupation: Electrical engineer  ?  Comment:    ?Tobacco Use  ? Smoking status: Never  ?  Passive exposure: Past  ? Smokeless tobacco: Never  ?Vaping Use  ? Vaping Use: Never used  ?Substance and Sexual Activity  ? Alcohol use: No  ?  Alcohol/week: 0.0 standard drinks  ? Drug use: No  ? Sexual  activity: Not on file  ?Other Topics Concern  ? Not on file  ?Social History Narrative  ? Patient signed designated party release form and gives Alexis Pierce (spouse) [phone] access to medical records.  ? ?Social Determinants of Health  ? ?Financial Resource Strain: Not on file  ?Food Insecurity: Not on file  ?Transportation Needs: Not on file  ?Physical Activity: Not on file  ?Stress: Not on file  ?Social Connections: Not on file  ?Intimate Partner Violence: Not on file  ? ?Review of Systems ?So panicky her hunger is not that much ?Trying to work--also home schooling niece ?MIL has moved in with them (DM but doesn't do the right things) ?Husband still doesn't understand her issues ? ?   ?Objective:  ? Physical Exam ?Constitutional:   ?   Comments: Very upset and tearful  ?Neurological:  ?   Mental Status: She is alert.  ?Psychiatric:  ?   Comments: Tearful and upset ?Clearly depressed but not as bad as last visit  ?  ? ? ? ? ?   ?Assessment & Plan:  ? ?

## 2021-10-08 ENCOUNTER — Other Ambulatory Visit: Payer: Self-pay | Admitting: Internal Medicine

## 2021-10-08 NOTE — Telephone Encounter (Signed)
Last filled 09-07-21 #60 ?Last OV 09-11-21 ?Next OV 10-30-21 ?[facility] ?

## 2021-10-09 ENCOUNTER — Ambulatory Visit: Payer: Self-pay | Admitting: Internal Medicine

## 2021-10-30 ENCOUNTER — Telehealth: Payer: Self-pay

## 2021-10-30 ENCOUNTER — Ambulatory Visit: Payer: Self-pay | Admitting: Internal Medicine

## 2021-10-30 NOTE — Telephone Encounter (Signed)
[facility] Night - Client ?TELEPHONE ADVICE RECORD ?AccessNurse? ?Patient ?Name: ?Alexis ST ?Pierce ?Gender: Female ?DOB: 1970/08/15 ?Age: 51 Y 69 M 19 D ?Return ?Phone ?Number: ?[id number] ?(Primary) ?Address: ?City/ ?State/ ?Zip: [name] ? [address] ?Client [facility] Night - Client ?Client Site [facility] - Night ?Provider Tillman Abide- MD ?Contact Type Call ?Who Is Calling Patient / Member / Family / Caregiver ?Call Type Triage / Clinical ?Relationship To Patient Self ?Return Phone Number [phone] (Primary) ?Chief Complaint Vomiting ?Reason for Call Request to [facility] Appointment ?Initial Comment Caller states she wants to cancel, 102.3 fever, ?vomiting ?Translation No ?Nurse Assessment ?Nurse: Scales, RN, Christie Nottingham Date/Time (Eastern Time): 10/30/2021 5:53:59 AM ?Confirm and document reason for call. If ?symptomatic, describe symptoms. ?---Caller states was calling to cancel an appointment, ?Woke up at 1230. 102.3 oral current temp, vomiting d/ ?t headache. Headache 7-8/10 ?Does the patient have any new or worsening ?symptoms? ---Yes ?Will a triage be completed? ---Yes ?Related visit to physician within the last 2 weeks? ---No ?Does the PT have any chronic conditions? (i.e. ?diabetes, asthma, this includes High risk factors for ?pregnancy, etc.) ?---No ?Is the patient pregnant or possibly pregnant? (Ask ?all females between the ages of 39-55) ---No ?Is this a behavioral health or substance abuse call? ---No ?Guidelines ?Guideline Title Affirmed Question Affirmed Notes Nurse Date/Time (Eastern ?Time) ?Fever [1] Fever AND [2] ?no signs of serious ?infection or localizing ?symptoms (all other ?triage questions ?negative) ?Scales, RN, Christie Nottingham 10/30/2021 5:55:34 ?AM ?Disp. Time (Eastern ?Time) Disposition Final User ?10/30/2021 5:59:54 AM SEE PCP WITHIN 3 DAYS Yes Scales, RN, Christie Nottingham ?PLEASE NOTE: All timestamps contained within this  report are represented as [place] Standard Time. ?CONFIDENTIALTY NOTICE: This fax transmission is intended only for the addressee. It contains information that is legally privileged, confidential or ?otherwise protected from use or disclosure. If you are not the intended recipient, you are strictly prohibited from reviewing, disclosing, copying using ?or disseminating any of this information or taking any action in reliance on or regarding this information. If you have received this fax in error, please ?notify us immediately by telephone so that we can arrange for its return to [place]. Phone: [phone], Toll-Free: [phone], Fax: [phone] ?Page: 2 of 2 ?Call Id: [id number] ?Disposition Overriden: Home Care ?Override Reason: Patient?s symptoms need a higher level of care ?Caller Disagree/Comply Comply ?Caller Understands Yes ?PreDisposition Home Care ?Care Advice Given Per Guideline ?HOME CARE:experiencing fever w/ headache CARE ADVICE given per Fever (Adult) guideline. * Drink cold fluids to prevent ?dehydration. CALL BACK IF: * You become worse CARE ADVICE given per Fever (Adult) guideline. ?Referrals ?REFERRED TO PCP OFFIC ?

## 2021-10-30 NOTE — Telephone Encounter (Signed)
Per appt notes pt has already cancelled 6 mth FU appt and rescheduled for 11/06/21. Alexis Pierce said pt did not want to schedule appt for current sickness due to not wanting to get out of bed.sending note to Dr Alphonsus Sias and Carollee Herter CMA. ?

## 2021-11-01 NOTE — Telephone Encounter (Signed)
Left message for pt to see how she was feeling.

## 2021-11-06 ENCOUNTER — Other Ambulatory Visit: Payer: Self-pay | Admitting: Internal Medicine

## 2021-11-06 ENCOUNTER — Ambulatory Visit: Payer: Self-pay | Admitting: Internal Medicine

## 2021-11-06 NOTE — Telephone Encounter (Signed)
Last filled 10-08-21 #60 Last OV 09-11-21 Next OV 11-07-21 [facility]

## 2021-11-07 ENCOUNTER — Ambulatory Visit (INDEPENDENT_AMBULATORY_CARE_PROVIDER_SITE_OTHER): Payer: Self-pay | Admitting: Internal Medicine

## 2021-11-07 ENCOUNTER — Encounter: Payer: Self-pay | Admitting: Internal Medicine

## 2021-11-07 DIAGNOSIS — F319 Bipolar disorder, unspecified: Secondary | ICD-10-CM

## 2021-11-07 MED ORDER — ALPRAZOLAM 1 MG PO TABS: 1.0000 mg | ORAL_TABLET | Freq: Three times a day (TID) | ORAL | 0 refills | Status: DC | PRN

## 2021-11-07 NOTE — Assessment & Plan Note (Signed)
Has pattern of irritability Often with stress though--which seems better  Is off meds except for xanax--takes tid at times If worsens again, would restart the aripiprazole (not the fluoxetine)

## 2021-11-07 NOTE — Progress Notes (Signed)
Subjective:    Patient ID: Alexis Pierce, female    DOB: Mar 21, 1971, 51 y.o.   MRN: [id number]  HPI Here for follow up of bipolar depression  Took the aripiprazole for 2 weeks Felt the same and "couldn't get out of my head" or focus Has been trying praying more--and stopped the aripiprazole and prozac Still using the xanax bid--and occasionally needs an extra one (if feels anxious)  "Doing a lot of sour searching, praying" and reading She wants to "do this on my own with God" Still feels "blah" some days Now--"not living in my head" the same way  Still has mother in law living in house Some issues with that--she doesn't do any housework, etc (just stays in room and bed all day) Husband got promotion last week---back and forth to [place] often They haven't been arguing as much about her not working  Was sick last week---dizzy and vomiting (recurrent thing) Was around grandkids last weekend--2 diagnosed with strep, 1 with roseola Itching in throat--thinks it is allergies  Current Outpatient Medications on File Prior to Visit  Medication Sig Dispense Refill   albuterol (VENTOLIN HFA) 108 (90 Base) MCG/ACT inhaler Inhale 2 puffs into the lungs every 6 (six) hours as needed for wheezing or shortness of breath. Use with spacer 18 g 0   ALPRAZolam (XANAX) 1 MG tablet TAKE 1/2 TO 1 TABLET BY MOUTH TWICE A DAY AS NEEDED FOR ANXIETY 60 tablet 0   ARIPiprazole (ABILIFY) 10 MG tablet Take 1 tablet (10 mg total) by mouth daily. 30 tablet 5   FLUoxetine (PROZAC) 20 MG capsule Take 1 capsule (20 mg total) by mouth daily. 90 capsule 1   meclizine (ANTIVERT) 25 MG tablet Take 1-2 tablets (25-50 mg total) by mouth 3 (three) times daily as needed for dizziness. 60 tablet 3   naphazoline (NAPHCON) 0.1 % ophthalmic solution Place 2 drops into both eyes 4 (four) times daily as needed for irritation. 15 mL 3   ondansetron (ZOFRAN) 4 MG tablet Take 1 tablet (4 mg total) by mouth every 8 (eight)  hours as needed. 30 tablet 1   No current facility-administered medications on file prior to visit.    Allergies  Allergen Reactions   Ibuprofen Nausea And Vomiting    Higher Doses 600mg  or more   Penicillins     Shaking, vomiting   Hydrocodone-Acetaminophen     REACTION: nausea   Oxycodone-Acetaminophen     REACTION: nausea    Past Medical History:  Diagnosis Date   Anxiety 1990   Depression 2004   Insomnia    Suicidal intent 2004   pills   Syncope and collapse    VD (venereal disease)    x 2    Past Surgical History:  Procedure Laterality Date   ABDOMINAL HYSTERECTOMY  2005   endometriosis    Family History  Problem Relation Age of Onset   Hypertension Mother    Stroke Mother        x 2   Obesity Mother    Cancer Father        bladder, throat, and prostate   Cancer Maternal Grandmother        lung    Social History   Socioeconomic History   Marital status: Married    Spouse name: Not on file   Number of children: 2   Years of education: Not on file   Highest education level: Not on file  Occupational History   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   Social Determinants of Health   Financial Resource Strain: Not on file  Food Insecurity: Not on file  Transportation Needs: Not on file  Physical Activity: Not on file  Stress: Not on file  Social Connections: Not on file  Intimate Partner Violence: Not on file   Review of Systems Sleeping okay Appetite is fine----weight stable    Objective:    Physical Exam Constitutional:      Appearance: Normal appearance.  HENT:     Mouth/Throat:     Pharynx: No oropharyngeal exudate or posterior oropharyngeal erythema.  Musculoskeletal:     Cervical back: Neck supple.  Lymphadenopathy:     Cervical: No cervical adenopathy.  Neurological:     Mental Status: She is alert.           Assessment & Plan:

## 2021-12-04 ENCOUNTER — Other Ambulatory Visit: Payer: Self-pay | Admitting: Internal Medicine

## 2021-12-04 NOTE — Telephone Encounter (Signed)
Last filled 11-07-21 #60 Last OV 11-07-21 Next OV 05-13-22 [facility]

## 2022-01-02 ENCOUNTER — Other Ambulatory Visit: Payer: Self-pay | Admitting: Internal Medicine

## 2022-01-02 NOTE — Telephone Encounter (Signed)
Last filled 12-04-21 #60 Last OV 11-07-21 Next OV 05-13-22 [facility]

## 2022-01-29 ENCOUNTER — Other Ambulatory Visit: Payer: Self-pay | Admitting: Internal Medicine

## 2022-01-29 NOTE — Telephone Encounter (Signed)
Last filled 01-02-22 #60 Last OV 11-07-21 Next OV 05-13-22 [facility]

## 2022-01-31 ENCOUNTER — Ambulatory Visit (INDEPENDENT_AMBULATORY_CARE_PROVIDER_SITE_OTHER): Payer: Self-pay | Admitting: Internal Medicine

## 2022-01-31 ENCOUNTER — Encounter: Payer: Self-pay | Admitting: Internal Medicine

## 2022-01-31 DIAGNOSIS — J0140 Acute pansinusitis, unspecified: Secondary | ICD-10-CM | POA: Insufficient documentation

## 2022-01-31 MED ORDER — DOXYCYCLINE HYCLATE 100 MG PO TABS: 100.0000 mg | ORAL_TABLET | Freq: Two times a day (BID) | ORAL | 0 refills | Status: DC

## 2022-01-31 NOTE — Progress Notes (Signed)
Subjective:    Patient ID: Alexis Pierce, female    DOB: 21-Aug-1970, 51 y.o.   MRN: [id number]  HPI Here due to persistent respiratory symptoms  Left sinus congestion and drainage for 2 weeks Worst drainage in AM--then clears Lots of post nasal drip Some soreness in left anterior cervical nodes in past few days Then felt knot in left cheek (sub-Q) Also some swelling now--left mandible  No left lower teeth  No fever Some cough with the drainage  Taking mucinex---may have helped slightly  Current Outpatient Medications on File Prior to Visit  Medication Sig Dispense Refill   albuterol (VENTOLIN HFA) 108 (90 Base) MCG/ACT inhaler Inhale 2 puffs into the lungs every 6 (six) hours as needed for wheezing or shortness of breath. Use with spacer 18 g 0   ALPRAZolam (XANAX) 1 MG tablet TAKE 1/2 TO 1 TABLET BY MOUTH TWICE A DAY AS NEEDED FOR ANXIETY 60 tablet 0   meclizine (ANTIVERT) 25 MG tablet Take 1-2 tablets (25-50 mg total) by mouth 3 (three) times daily as needed for dizziness. 60 tablet 3   naphazoline (NAPHCON) 0.1 % ophthalmic solution Place 2 drops into both eyes 4 (four) times daily as needed for irritation. 15 mL 3   ondansetron (ZOFRAN) 4 MG tablet Take 1 tablet (4 mg total) by mouth every 8 (eight) hours as needed. 30 tablet 1   No current facility-administered medications on file prior to visit.    Allergies  Allergen Reactions   Ibuprofen Nausea And Vomiting    Higher Doses 600mg  or more   Penicillins     Shaking, vomiting   Hydrocodone-Acetaminophen     REACTION: nausea   Oxycodone-Acetaminophen     REACTION: nausea    Past Medical History:  Diagnosis Date   Anxiety 1990   Depression 2004   Insomnia    Suicidal intent 2004   pills   Syncope and collapse    VD (venereal disease)    x 2    Past Surgical History:  Procedure Laterality Date   ABDOMINAL HYSTERECTOMY  2005   endometriosis    Family History  Problem Relation Age of Onset    Hypertension Mother    Stroke Mother        x 2   Obesity Mother    Cancer Father        bladder, throat, and prostate   Cancer Maternal Grandmother        lung    Social History   Socioeconomic History   Marital status: Married    Spouse name: Not on file   Number of children: 2   Years of education: Not on file   Highest education level: Not on file  Occupational History   Occupation: 2006    Comment:    Tobacco Use   Smoking status: Never    Passive exposure: Past   Smokeless tobacco: Never  Vaping Use   Vaping Use: Never used  Substance and Sexual Activity   Alcohol use: No    Alcohol/week: 0.0 standard drinks of alcohol   Drug use: No   Sexual activity: Not on file  Other Topics Concern   Not on file  Social History Narrative   Patient signed designated party release form and gives Ladana Chavero (spouse) [phone] access to medical records.   Social Determinants of Health   Financial Resource Strain: Not on file  Food Insecurity: Not on file  Transportation Needs: Not on file  Physical Activity: Not on file  Stress: Not on file  Social Connections: Not on file  Intimate Partner Violence: Not on file   Review of Systems Got raw place in nose from drainage--used antibiotic ointment Some left ear aching--popped this morning and that helped     Objective:   Physical Exam Constitutional:      Appearance: Normal appearance.  HENT:     Head:     Comments: Mild left frontal and maxillary tenderness    Right Ear: Tympanic membrane and ear canal normal.     Left Ear: Tympanic membrane and ear canal normal.     Nose:     Comments: Mild inflammation only    Mouth/Throat:     Pharynx: No oropharyngeal exudate or posterior oropharyngeal erythema.  Neck:     Comments: No facial mass Slight tenderness in the ant cervical area on left Pulmonary:     Effort: Pulmonary effort is normal.     Breath sounds: Normal breath sounds. No wheezing or  rales.  Musculoskeletal:     Cervical back: Neck supple.  Lymphadenopathy:     Cervical: No cervical adenopathy.  Neurological:     Mental Status: She is alert.            Assessment & Plan:

## 2022-01-31 NOTE — Assessment & Plan Note (Signed)
Mild but persistent symptoms going back 2 weeks Discussed analgesics Will give doxy 100 bid for 7 days

## 2022-02-28 ENCOUNTER — Other Ambulatory Visit: Payer: Self-pay | Admitting: Internal Medicine

## 2022-02-28 NOTE — Telephone Encounter (Signed)
Last filled 01-30-22 #60 Last OV 01-31-22 Next OV 05-13-22 [facility]

## 2022-03-28 ENCOUNTER — Other Ambulatory Visit: Payer: Self-pay | Admitting: Internal Medicine

## 2022-03-29 ENCOUNTER — Telehealth: Payer: Self-pay | Admitting: Internal Medicine

## 2022-03-29 NOTE — Telephone Encounter (Signed)
Patient called in stating that her medication was switched up at her last visit. She was wondering if something could be called in to help her sleep because the current medication isn't allowing her to sleep. She would like for someone for to give her a call. Thank you!

## 2022-03-30 ENCOUNTER — Other Ambulatory Visit: Payer: Self-pay | Admitting: Internal Medicine

## 2022-03-30 ENCOUNTER — Encounter: Payer: Self-pay | Admitting: Internal Medicine

## 2022-03-31 ENCOUNTER — Other Ambulatory Visit: Payer: Self-pay | Admitting: Internal Medicine

## 2022-04-01 ENCOUNTER — Other Ambulatory Visit: Payer: Self-pay | Admitting: Internal Medicine

## 2022-04-01 MED ORDER — TRAZODONE HCL 50 MG PO TABS: 50.0000 mg | ORAL_TABLET | Freq: Every day | ORAL | 11 refills | Status: DC

## 2022-04-01 MED ORDER — ALPRAZOLAM 1 MG PO TABS: ORAL_TABLET | ORAL | 0 refills | Status: DC

## 2022-04-01 NOTE — Telephone Encounter (Signed)
Patient called in stating that she received a mychart message letting her know that her xanax rx was denied. She stated that she would like to know why because she really needs this medication. She's asking for a phone call from shannon regarding this matter.

## 2022-04-01 NOTE — Telephone Encounter (Signed)
She also sent a MyChart message over the weekend because this message was not sent to Dr Silvio Pate Friday.

## 2022-04-01 NOTE — Telephone Encounter (Signed)
See MyChart message for further correspondence.

## 2022-04-17 NOTE — Progress Notes (Unsigned)
    Yannis Gumbs T. Allye Hoyos, MD, [place] at [facility] [place], [address]  Phone: [phone]  FAX: [phone]  Alexis Pierce - 51 y.o. female  MRN [id number]  Date of Birth: [birth year]  Date: 04/18/2022  PCP: Venia Carbon, MD  Referral: Venia Carbon, MD  No chief complaint on file.  Subjective:   Alexis Pierce is a 51 y.o. very pleasant female patient with There is no height or weight on file to calculate BMI. who presents with the following:  Very pleasant patient who presents after traumatic fall.  She has a left leg contusion and now has wrist pain.     Review of Systems is noted in the HPI, as appropriate  Objective:   There were no vitals taken for this visit.  GEN: No acute distress; alert,appropriate. PULM: Breathing comfortably in no respiratory distress PSYCH: Normally interactive.   Laboratory and Imaging Data:  Assessment and Plan:   ***

## 2022-04-18 ENCOUNTER — Ambulatory Visit: Payer: Self-pay

## 2022-04-18 ENCOUNTER — Ambulatory Visit (INDEPENDENT_AMBULATORY_CARE_PROVIDER_SITE_OTHER): Payer: Self-pay | Admitting: Family Medicine

## 2022-04-18 ENCOUNTER — Ambulatory Visit: Payer: Self-pay | Admitting: Family Medicine

## 2022-04-18 ENCOUNTER — Ambulatory Visit (INDEPENDENT_AMBULATORY_CARE_PROVIDER_SITE_OTHER)
Admission: RE | Admit: 2022-04-18 | Discharge: 2022-04-18 | Disposition: A | Discharge status: Home / Self Care | Payer: Self-pay | Source: Ambulatory Visit | Attending: Family Medicine | Admitting: Family Medicine

## 2022-04-18 ENCOUNTER — Encounter: Payer: Self-pay | Admitting: Family Medicine

## 2022-04-18 VITALS — BP 120/80 | HR 72 | Temp 97.3°F | Ht 63.0 in | Wt 154.1 lb

## 2022-04-18 DIAGNOSIS — M25532 Pain in left wrist: Secondary | ICD-10-CM

## 2022-04-18 DIAGNOSIS — S7012XA Contusion of left thigh, initial encounter: Secondary | ICD-10-CM

## 2022-04-25 ENCOUNTER — Other Ambulatory Visit: Payer: Self-pay | Admitting: Internal Medicine

## 2022-04-29 ENCOUNTER — Other Ambulatory Visit: Payer: Self-pay | Admitting: Internal Medicine

## 2022-04-29 NOTE — Telephone Encounter (Signed)
Last filled 04-01-22 #60 Last OV Acute 04-18-22 Next OV 05-13-22 [facility]

## 2022-05-13 ENCOUNTER — Ambulatory Visit (INDEPENDENT_AMBULATORY_CARE_PROVIDER_SITE_OTHER): Payer: Self-pay | Admitting: Internal Medicine

## 2022-05-13 ENCOUNTER — Encounter: Payer: Self-pay | Admitting: Internal Medicine

## 2022-05-13 VITALS — BP 104/68 | HR 88 | Temp 97.1°F | Ht 63.0 in | Wt 157.0 lb

## 2022-05-13 DIAGNOSIS — F39 Unspecified mood [affective] disorder: Secondary | ICD-10-CM

## 2022-05-13 DIAGNOSIS — M25562 Pain in left knee: Secondary | ICD-10-CM

## 2022-05-13 MED ORDER — TRAZODONE HCL 50 MG PO TABS: 150.0000 mg | ORAL_TABLET | Freq: Every evening | ORAL | 0 refills | Status: DC | PRN

## 2022-05-13 NOTE — Assessment & Plan Note (Signed)
Unclear if bipolar--but no mania and depression is better Will stick with just the alprazolam 1 bid (wean as possible)

## 2022-05-13 NOTE — Patient Instructions (Signed)
Please try topical diclofenac on your knee and shoulder---2gm up to three times a day. If that is not enough--you can add aleve 2 twice a day

## 2022-05-13 NOTE — Assessment & Plan Note (Signed)
Mostly related to recent fall No major PE findings Discussed aleve 2 bid if topical diclofenac is not enough
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
## 2022-05-27 ENCOUNTER — Other Ambulatory Visit: Payer: Self-pay | Admitting: Internal Medicine

## 2022-05-27 NOTE — Telephone Encounter (Signed)
Last filled 04-29-22 #60 Last OV 05-13-22 Next OV 11-12-22 [facility]

## 2022-06-07 ENCOUNTER — Other Ambulatory Visit: Payer: Self-pay | Admitting: Internal Medicine

## 2022-06-26 ENCOUNTER — Other Ambulatory Visit: Payer: Self-pay | Admitting: Internal Medicine

## 2022-06-26 NOTE — Telephone Encounter (Signed)
Last filled 05-27-22 #60 Last OV 05-13-22 Next OV 11-12-22 [facility]

## 2022-07-03 ENCOUNTER — Encounter: Payer: Self-pay | Admitting: Internal Medicine

## 2022-07-04 ENCOUNTER — Ambulatory Visit (INDEPENDENT_AMBULATORY_CARE_PROVIDER_SITE_OTHER): Payer: Self-pay | Admitting: Internal Medicine

## 2022-07-04 ENCOUNTER — Encounter: Payer: Self-pay | Admitting: Internal Medicine

## 2022-07-04 VITALS — BP 100/64 | HR 96 | Temp 97.3°F | Ht 63.0 in | Wt 157.0 lb

## 2022-07-04 DIAGNOSIS — J0140 Acute pansinusitis, unspecified: Secondary | ICD-10-CM

## 2022-07-04 DIAGNOSIS — N951 Menopausal and female climacteric states: Secondary | ICD-10-CM

## 2022-07-04 DIAGNOSIS — F39 Unspecified mood [affective] disorder: Secondary | ICD-10-CM

## 2022-07-04 MED ORDER — TRAZODONE HCL 50 MG PO TABS: 150.0000 mg | ORAL_TABLET | Freq: Every evening | ORAL | 5 refills | Status: DC | PRN

## 2022-07-04 MED ORDER — DOXYCYCLINE HYCLATE 100 MG PO TABS: 100.0000 mg | ORAL_TABLET | Freq: Two times a day (BID) | ORAL | 0 refills | Status: DC

## 2022-07-04 MED ORDER — VENLAFAXINE HCL ER 37.5 MG PO CP24: 37.5000 mg | ORAL_CAPSULE | Freq: Every day | ORAL | 3 refills | Status: DC

## 2022-07-04 NOTE — Assessment & Plan Note (Signed)
Mostly reactive depression and anger No mania Not MDD as in the past Hasn't done well with meds Continues on the xanax 1mg  bid and trazodone 150-200 bedtime Wonders if menopause could be some of it Will refer for counseling Will try low dose venlafaxine ---to see if that is helpful

## 2022-07-04 NOTE — Assessment & Plan Note (Signed)
Symptoms may be related to dry heat Discussed humidifier and nasal saline Trial doxy 100 bid x 1 week

## 2022-07-04 NOTE — Progress Notes (Signed)
Subjective:    Patient ID: Alexis Pierce, female    DOB: 1970-11-25, 52 y.o.   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  His mom and his dad (divorced) with girlfriend are all staying with them "I feel like I am a maid" They won't do their own laundry or cook/dishes No one listens to her or cares about her Up and down ----happy (a bit), then crying and feeling angry, etc Not sleeping despite the trazodone  Had been going out with sister---but for a couple of weeks, hasn't even wanted to do that Depressed, angry No thoughts of dying or suicide Doesn't want other medications  Husband can get mad at her She doesn't feel in danger Does faith--not active in a church   Has had some vertigo recently--needed the meclizine and ondansetron Some ongoing sinus symptoms for near 2 months Comes and goes for a week at a time---raw nose, bloody yellow nasal discharge No fever Some cough--from post nasal drainage Some SOB--but only from sense of panic with her mood New electric heat--- very dry and hot  Current Outpatient Medications on File Prior to Visit  Medication Sig Dispense Refill   albuterol (VENTOLIN HFA) 108 (90 Base) MCG/ACT inhaler Inhale 2 puffs into the lungs every 6 (six) hours as needed for wheezing or shortness of breath. Use with spacer 18 g 0   ALPRAZolam (XANAX) 1 MG tablet TAKE 1/2 TO 1 TABLET BY MOUTH TWICE A DAY AS NEEDED FOR ANXIETY 60 tablet 0   meclizine (ANTIVERT) 25 MG tablet Take 1-2 tablets (25-50 mg total) by mouth 3 (three) times daily as needed for dizziness. 60 tablet 3   naphazoline (NAPHCON) 0.1 % ophthalmic solution Place 2 drops into both eyes 4 (four) times daily as needed for irritation. 15 mL 3   ondansetron  (ZOFRAN) 4 MG tablet TAKE 1 TABLET BY MOUTH EVERY 8 HOURS AS NEEDED 30 tablet 0   traZODone (DESYREL) 50 MG tablet Take 3-4 tablets (150-200 mg total) by mouth at bedtime as needed for sleep. 1 tablet 0   No current facility-administered medications on file prior to visit.    Allergies  Allergen Reactions   Ibuprofen Nausea And Vomiting    Higher Doses 600mg  or more   Penicillins     Shaking, vomiting   Hydrocodone-Acetaminophen     REACTION: nausea   Oxycodone-Acetaminophen     REACTION: nausea    Past Medical History:  Diagnosis Date   Anxiety 1990   Depression 2004   Insomnia    Suicidal intent 2004   pills   Syncope and collapse    VD (venereal disease)    x 2    Past Surgical History:  Procedure Laterality Date   ABDOMINAL HYSTERECTOMY  2005   endometriosis    Family History  Problem Relation Age of Onset   Hypertension Mother    Stroke Mother        x 2   Obesity Mother    Cancer Father        bladder, throat, and prostate   Cancer Maternal Grandmother        lung    Social History   Socioeconomic History   Marital  status: Married    Spouse name: Not on file   Number of children: 2   Years of education: Not on file   Highest education level: Not on file  Occupational History   Occupation: Electrical engineer    Comment:    Tobacco Use   Smoking status: Never    Passive exposure: Past   Smokeless tobacco: Never  Vaping Use   Vaping Use: Never used  Substance and Sexual Activity   Alcohol use: No    Alcohol/week: 0.0 standard drinks of alcohol   Drug use: No   Sexual activity: Not on file  Other Topics Concern   Not on file  Social History Narrative   Patient signed designated party release form and gives Angeliah Wisdom (spouse) [phone] access to medical records.   Social Determinants of Health   Financial Resource Strain: Not on file  Food Insecurity: Not on file  Transportation Needs: Not on file  Physical Activity: Not on file   Stress: Not on file  Social Connections: Not on file  Intimate Partner Violence: Not on file   Review of Systems Eating "too much" No weight gain since November    Objective:   Physical Exam Constitutional:      Appearance: Normal appearance.  HENT:     Head:     Comments: Frontal and maxillary tenderness    Right Ear: Tympanic membrane and ear canal normal.     Ears:     Comments: Fluid without inflammation on left    Nose: Congestion present.     Mouth/Throat:     Pharynx: No oropharyngeal exudate or posterior oropharyngeal erythema.  Pulmonary:     Effort: Pulmonary effort is normal.     Breath sounds: Normal breath sounds. No wheezing or rales.  Musculoskeletal:     Cervical back: Neck supple.  Lymphadenopathy:     Cervical: No cervical adenopathy.  Neurological:     Mental Status: She is alert.  Psychiatric:     Comments: Upset but no overtly depressed            Assessment & Plan:

## 2022-07-04 NOTE — Assessment & Plan Note (Signed)
Likely exacerbating frustration with her tough situation Will try low dose venlafaxine

## 2022-07-23 ENCOUNTER — Other Ambulatory Visit: Payer: Self-pay | Admitting: Internal Medicine

## 2022-07-23 NOTE — Telephone Encounter (Signed)
Last filled 06-26-22 #60 Last OV 07-04-22 Next OV 08-05-22 [facility]

## 2022-07-26 ENCOUNTER — Other Ambulatory Visit: Payer: Self-pay | Admitting: Internal Medicine

## 2022-07-26 MED ORDER — VENLAFAXINE HCL ER 37.5 MG PO CP24: 37.5000 mg | ORAL_CAPSULE | Freq: Every day | ORAL | 2 refills | Status: DC

## 2022-07-26 MED ORDER — TRAZODONE HCL 50 MG PO TABS: 150.0000 mg | ORAL_TABLET | Freq: Every evening | ORAL | 2 refills | Status: DC | PRN

## 2022-07-26 NOTE — Telephone Encounter (Signed)
Due to expense of these two medications below   traZODone (DESYREL) 50 MG tablet   --  07/26/22  --  Venia Carbon, MD    TAKE 3-4 TABLETS (150-200 MG TOTAL) BY MOUTH AT BEDTIME AS NEEDED FOR SLEEP.   venlafaxine XR (EFFEXOR-XR) 37.5 MG 24 hr capsule   --  07/26/22  --  Venia Carbon, MD   TAKE 1 CAPSULE BY MOUTH DAILY WITH BREAKFAST.   Patient prefers they go to:  [facility], [place] Phone: [phone]  Fax: [phone]

## 2022-07-26 NOTE — Telephone Encounter (Signed)
Sent to [facility]. [facility] and [facility] the other rxs.

## 2022-07-26 NOTE — Addendum Note (Signed)
Addended by: Pilar Grammes on: 07/26/2022 03:50 PM   Modules accepted: Orders

## 2022-07-29 MED ORDER — ONDANSETRON HCL 4 MG PO TABS: 4.0000 mg | ORAL_TABLET | Freq: Three times a day (TID) | ORAL | 0 refills | Status: DC | PRN

## 2022-08-02 NOTE — Telephone Encounter (Signed)
FYI to PCP

## 2022-08-05 ENCOUNTER — Ambulatory Visit: Payer: Self-pay | Admitting: Internal Medicine

## 2022-08-16 ENCOUNTER — Encounter: Payer: Self-pay | Admitting: Internal Medicine

## 2022-08-16 ENCOUNTER — Ambulatory Visit (INDEPENDENT_AMBULATORY_CARE_PROVIDER_SITE_OTHER): Payer: Self-pay | Admitting: Internal Medicine

## 2022-08-16 VITALS — BP 110/80 | HR 75 | Temp 97.2°F | Ht 63.0 in | Wt 156.0 lb

## 2022-08-16 DIAGNOSIS — K219 Gastro-esophageal reflux disease without esophagitis: Secondary | ICD-10-CM | POA: Insufficient documentation

## 2022-08-16 DIAGNOSIS — F319 Bipolar disorder, unspecified: Secondary | ICD-10-CM

## 2022-08-16 MED ORDER — FLUOXETINE HCL 20 MG PO CAPS: 20.0000 mg | ORAL_CAPSULE | Freq: Every day | ORAL | 3 refills | Status: DC

## 2022-08-16 MED ORDER — ALPRAZOLAM 1 MG PO TABS: 1.0000 mg | ORAL_TABLET | Freq: Two times a day (BID) | ORAL | 0 refills | Status: DC | PRN

## 2022-08-16 MED ORDER — PANTOPRAZOLE SODIUM 40 MG PO TBEC: 40.0000 mg | DELAYED_RELEASE_TABLET | Freq: Every day | ORAL | 3 refills | Status: DC

## 2022-08-16 NOTE — Assessment & Plan Note (Signed)
Has really flared off the omeprazole Will try pantoprazole '40mg'$  daily instead (hopefully husband won't object to that)

## 2022-08-16 NOTE — Assessment & Plan Note (Addendum)
No mania--just depressed and persistent and worse Did okay with fluoxetine in the past--will restart at '20mg'$  daily Continue xanax 1 bid and trazodone '200mg'$  bedtime Discussed need to notify me if suicidal thoughts, etc

## 2022-08-16 NOTE — Progress Notes (Signed)
Subjective:    Patient ID: Alexis Pierce, female    DOB: January 28, 1971, 52 y.o.   MRN: [id number]  HPI Here for follow up of mood problems  Feels like she may be some worse Has been crying all morning---cries when she thinks about her situation Off the prozac for a year---now feels like she needs it back  Did try the venlafaxine--"I felt weird---off in my head" Seemed to aggravate stomach issues  Trouble even getting out of bed She still has to do all the cooking, housework, etc for husband and his family (mother/father/father's girlfriend) Still grievning mom's recent death Wishes she had means to leave--but no job, etc No suicidal ideation  Having acid come on after eating all the time Feels it in epigastrium and into throat Some constipation as well---then very painful when finally went No trouble swallowing Husband didn't want her on it---worried about kidney problems  Current Outpatient Medications on File Prior to Visit  Medication Sig Dispense Refill   albuterol (VENTOLIN HFA) 108 (90 Base) MCG/ACT inhaler Inhale 2 puffs into the lungs every 6 (six) hours as needed for wheezing or shortness of breath. Use with spacer 18 g 0   ALPRAZolam (XANAX) 1 MG tablet TAKE 1/2 TO 1 TABLET BY MOUTH TWICE A DAY AS NEEDED FOR ANXIETY 60 tablet 0   meclizine (ANTIVERT) 25 MG tablet Take 1-2 tablets (25-50 mg total) by mouth 3 (three) times daily as needed for dizziness. 60 tablet 3   naphazoline (NAPHCON) 0.1 % ophthalmic solution Place 2 drops into both eyes 4 (four) times daily as needed for irritation. 15 mL 3   ondansetron (ZOFRAN) 4 MG tablet Take 1 tablet (4 mg total) by mouth every 8 (eight) hours as needed. 30 tablet 0   traZODone (DESYREL) 50 MG tablet Take 3-4 tablets (150-200 mg total) by mouth at bedtime as needed for sleep. 360 tablet 2   No current facility-administered medications on file prior to visit.    Allergies  Allergen Reactions   Ibuprofen Nausea And Vomiting     Higher Doses '600mg'$  or more   Penicillins     Shaking, vomiting   Hydrocodone-Acetaminophen     REACTION: nausea   Oxycodone-Acetaminophen     REACTION: nausea    Past Medical History:  Diagnosis Date   Anxiety 1990   Depression 2004   Insomnia    Suicidal intent 2004   pills   Syncope and collapse    VD (venereal disease)    x 2    Past Surgical History:  Procedure Laterality Date   ABDOMINAL HYSTERECTOMY  2005   endometriosis    Family History  Problem Relation Age of Onset   Hypertension Mother    Stroke Mother        x 2   Obesity Mother    Cancer Father        bladder, throat, and prostate   Cancer Maternal Grandmother        lung    Social History   Socioeconomic History   Marital status: Married    Spouse name: Not on file   Number of children: 2   Years of education: Not on file   Highest education level: Not on file  Occupational History   Occupation: Electrical engineer    Comment:    Tobacco Use   Smoking status: Never    Passive exposure: Past   Smokeless tobacco: Never  Vaping Use   Vaping Use: Never used  Substance and Sexual Activity   Alcohol use: No    Alcohol/week: 0.0 standard drinks of alcohol   Drug use: No   Sexual activity: Not on file  Other Topics Concern   Not on file  Social History Narrative   Patient signed designated party release form and gives Sondi Gribbon (spouse) [phone] access to medical records.   Social Determinants of Health   Financial Resource Strain: Not on file  Food Insecurity: Not on file  Transportation Needs: Not on file  Physical Activity: Not on file  Stress: Not on file  Social Connections: Not on file  Intimate Partner Violence: Not on file   Review of Systems     Objective:   Physical Exam Psychiatric:        Mood and Affect: Mood normal.        Behavior: Behavior normal.     Comments: Tearful and depressed Insight and judgement seem intact Normal interaction             Assessment & Plan:

## 2022-09-13 ENCOUNTER — Other Ambulatory Visit: Payer: Self-pay | Admitting: Internal Medicine

## 2022-09-16 ENCOUNTER — Other Ambulatory Visit: Payer: Self-pay | Admitting: Internal Medicine

## 2022-09-16 NOTE — Telephone Encounter (Signed)
Last filled 08-16-22 #60 Last OV 08-16-22 Next OV 09-17-22 [facility]

## 2022-09-17 ENCOUNTER — Encounter: Payer: Self-pay | Admitting: Internal Medicine

## 2022-09-17 ENCOUNTER — Ambulatory Visit: Payer: Self-pay | Admitting: Internal Medicine

## 2022-09-17 VITALS — BP 120/80 | HR 68 | Temp 97.3°F | Ht 63.0 in | Wt 164.0 lb

## 2022-09-17 DIAGNOSIS — Z1211 Encounter for screening for malignant neoplasm of colon: Secondary | ICD-10-CM

## 2022-09-17 DIAGNOSIS — K219 Gastro-esophageal reflux disease without esophagitis: Secondary | ICD-10-CM

## 2022-09-17 DIAGNOSIS — F319 Bipolar disorder, unspecified: Secondary | ICD-10-CM

## 2022-09-17 LAB — COMPREHENSIVE METABOLIC PANEL
ALT: 15 U/L (ref 0–35)
AST: 22 U/L (ref 0–37)
Albumin: 4.2 g/dL (ref 3.5–5.2)
Alkaline Phosphatase: 57 U/L (ref 39–117)
BUN: 7 mg/dL (ref 6–23)
CO2: 29 mEq/L (ref 19–32)
Calcium: 9.1 mg/dL (ref 8.4–10.5)
Chloride: 104 mEq/L (ref 96–112)
Creatinine, Ser: 0.95 mg/dL (ref 0.40–1.20)
GFR: 68.8 mL/min (ref >60.00)
Glucose, Bld: 86 mg/dL (ref 70–99)
Potassium: 4.2 mEq/L (ref 3.5–5.1)
Sodium: 137 mEq/L (ref 135–145)
Total Bilirubin: 0.3 mg/dL (ref 0.2–1.2)
Total Protein: 6.5 g/dL (ref 6.0–8.3)

## 2022-09-17 LAB — CBC
HCT: 34.5 % — ABNORMAL LOW (ref 36.0–46.0)
Hemoglobin: 12 g/dL (ref 12.0–15.0)
MCHC: 34.7 g/dL (ref 30.0–36.0)
MCV: 90.5 fl (ref 78.0–100.0)
Platelets: 288 10*3/uL (ref 150.0–400.0)
RBC: 3.81 Mil/uL — ABNORMAL LOW (ref 3.87–5.11)
RDW: 13.3 % (ref 11.5–15.5)
WBC: 5.9 10*3/uL (ref 4.0–10.5)

## 2022-09-17 NOTE — Assessment & Plan Note (Signed)
Much better back on the fluoxetine 20 Trazodone 200mg  for sleep Xanax bid regularly

## 2022-09-17 NOTE — Assessment & Plan Note (Signed)
Better on the pantoprazole

## 2022-09-17 NOTE — Progress Notes (Signed)
Subjective:    Patient ID: Alexis Pierce, female    DOB: Jun 12, 1971, 52 y.o.   MRN: [id number]  HPI Here for follow up of depression and GERD  Feels better Back on fluoxetine 20mg  daily No apparent side effects  Husband's mom left to visit daughter in [place] That helped her Is coming back April 12th---could feel a surge of her anxiety Husband's dad and his girlfriend are not as much of a problem  Heartburn is much better on the pantoprazole No dysphagia  Current Outpatient Medications on File Prior to Visit  Medication Sig Dispense Refill   albuterol (VENTOLIN HFA) 108 (90 Base) MCG/ACT inhaler Inhale 2 puffs into the lungs every 6 (six) hours as needed for wheezing or shortness of breath. Use with spacer 18 g 0   ALPRAZolam (XANAX) 1 MG tablet Take 1 tablet by mouth twice daily as needed for anxiety 60 tablet 0   FLUoxetine (PROZAC) 20 MG capsule Take 1 capsule (20 mg total) by mouth daily. 90 capsule 3   meclizine (ANTIVERT) 25 MG tablet Take 1-2 tablets (25-50 mg total) by mouth 3 (three) times daily as needed for dizziness. 60 tablet 3   naphazoline (NAPHCON) 0.1 % ophthalmic solution Place 2 drops into both eyes 4 (four) times daily as needed for irritation. 15 mL 3   ondansetron (ZOFRAN) 4 MG tablet Take 1 tablet (4 mg total) by mouth every 8 (eight) hours as needed. 30 tablet 0   pantoprazole (PROTONIX) 40 MG tablet Take 1 tablet (40 mg total) by mouth daily. 90 tablet 3   traZODone (DESYREL) 50 MG tablet Take 3-4 tablets (150-200 mg total) by mouth at bedtime as needed for sleep. 360 tablet 2   No current facility-administered medications on file prior to visit.    Allergies  Allergen Reactions   Ibuprofen Nausea And Vomiting    Higher Doses 600mg  or more   Penicillins     Shaking, vomiting   Hydrocodone-Acetaminophen     REACTION: nausea   Oxycodone-Acetaminophen     REACTION: nausea    Past Medical History:  Diagnosis Date   Anxiety 1990   Depression  2004   Insomnia    Suicidal intent 2004   pills   Syncope and collapse    VD (venereal disease)    x 2    Past Surgical History:  Procedure Laterality Date   ABDOMINAL HYSTERECTOMY  2005   endometriosis    Family History  Problem Relation Age of Onset   Hypertension Mother    Stroke Mother        x 2   Obesity Mother    Cancer Father        bladder, throat, and prostate   Cancer Maternal Grandmother        lung    Social History   Socioeconomic History   Marital status: Married    Spouse name: Not on file   Number of children: 2   Years of education: Not on file   Highest education level: 12th grade  Occupational History   Occupation: Electrical engineer    Comment:    Tobacco Use   Smoking status: Never    Passive exposure: Past   Smokeless tobacco: Never  Vaping Use   Vaping Use: Never used  Substance and Sexual Activity   Alcohol use: No    Alcohol/week: 0.0 standard drinks of alcohol   Drug use: No   Sexual activity: Not on file  Other  Topics Concern   Not on file  Social History Narrative   Patient signed designated party release form and gives Gleda Cantone (spouse) [phone] access to medical records.   Social Determinants of Health   Financial Resource Strain: Low Risk  (09/16/2022)   Overall Financial Resource Strain (CARDIA)    Difficulty of Paying Living Expenses: Not hard at all  Food Insecurity: No Food Insecurity (09/16/2022)   Hunger Vital Sign    Worried About Running Out of Food in the Last Year: Never true    Ran Out of Food in the Last Year: Never true  Transportation Needs: No Transportation Needs (09/16/2022)   PRAPARE - Hydrologist (Medical): No    Lack of Transportation (Non-Medical): No  Physical Activity: Insufficiently Active (09/16/2022)   Exercise Vital Sign    Days of Exercise per Week: 5 days    Minutes of Exercise per Session: 20 min  Stress: Stress Concern Present (09/16/2022)   [place]    Feeling of Stress : Very much  Social Connections: Moderately Integrated (09/16/2022)   Social Connection and Isolation Panel [NHANES]    Frequency of Communication with Friends and Family: More than three times a week    Frequency of Social Gatherings with Friends and Family: Twice a week    Attends Religious Services: More than 4 times per year    Active Member of [employer] or Organizations: No    Attends Music therapist: Not on file    Marital Status: Married  Human resources officer Violence: Not on file   Review of Systems Sleeping better in past month (with mother in law gone) Appetite is okay--has gained 8#     Objective:   Physical Exam Constitutional:      Appearance: Normal appearance.  Neurological:     Mental Status: She is alert.  Psychiatric:        Mood and Affect: Mood normal.        Behavior: Behavior normal.            Assessment & Plan:

## 2022-09-18 ENCOUNTER — Other Ambulatory Visit: Payer: Self-pay | Admitting: Internal Medicine

## 2022-09-18 MED ORDER — ALPRAZOLAM 1 MG PO TABS: 1.0000 mg | ORAL_TABLET | Freq: Two times a day (BID) | ORAL | 0 refills | Status: DC | PRN

## 2022-09-18 NOTE — Telephone Encounter (Signed)
Patient called stating that [facility] is saying that they do not have the rx for  ALPRAZolam Duanne Moron) 1 MG tablet that was sent in on 09/16/2022.

## 2022-09-23 ENCOUNTER — Other Ambulatory Visit: Payer: Self-pay | Admitting: *Deleted

## 2022-09-23 DIAGNOSIS — Z1211 Encounter for screening for malignant neoplasm of colon: Secondary | ICD-10-CM

## 2022-09-24 LAB — FECAL OCCULT BLOOD, IMMUNOCHEMICAL: Fecal Occult Bld: NEGATIVE

## 2022-10-17 ENCOUNTER — Encounter: Payer: Self-pay | Admitting: Internal Medicine

## 2022-10-17 ENCOUNTER — Other Ambulatory Visit: Payer: Self-pay | Admitting: Internal Medicine

## 2022-10-17 NOTE — Telephone Encounter (Signed)
Last filled 09-18-22 #60 Last OV 09-17-22 Next OV 11-12-22 [facility]

## 2022-10-17 NOTE — Telephone Encounter (Signed)
Tried to call pt back but call cannot go through. Spoke to pharmacist and they were using the wrong profile for Dr Alphonsus Sias.

## 2022-10-17 NOTE — Telephone Encounter (Signed)
Patient called in and stated that the pharmacy isn't able to refill her prescription because Dr. Karle Starch DEA number is showing expired. Please Advise. Thank you!

## 2022-11-06 ENCOUNTER — Telehealth: Payer: Self-pay | Admitting: Internal Medicine

## 2022-11-06 MED ORDER — ALBUTEROL SULFATE HFA 108 (90 BASE) MCG/ACT IN AERS: 2.0000 | INHALATION_SPRAY | Freq: Four times a day (QID) | RESPIRATORY_TRACT | 0 refills | Status: AC | PRN

## 2022-11-06 NOTE — Telephone Encounter (Signed)
Rx sent electronically.  

## 2022-11-06 NOTE — Telephone Encounter (Signed)
Prescription Request  11/06/2022  LOV: 09/17/2022  What is the name of the medication or equipment? albuterol (VENTOLIN HFA) 108 (90 Base) MCG/ACT inhaler   Have you contacted your pharmacy to request a refill? Yes   Which pharmacy would you like this sent to?  [facility] [address] [place] - [address] [place] [address] Phone: [phone] Fax: [phone]    Patient notified that their request is being sent to the clinical staff for review and that they should receive a response within 2 business days.   Please advise at Mobile [phone] (mobile)

## 2022-11-12 ENCOUNTER — Ambulatory Visit: Payer: Self-pay | Admitting: Internal Medicine

## 2022-11-14 ENCOUNTER — Other Ambulatory Visit: Payer: Self-pay | Admitting: Internal Medicine

## 2022-11-14 MED ORDER — ALPRAZOLAM 1 MG PO TABS: 1.0000 mg | ORAL_TABLET | Freq: Two times a day (BID) | ORAL | 0 refills | Status: DC | PRN

## 2022-11-14 NOTE — Addendum Note (Signed)
Addended by: Tillman Abide I on: 11/14/2022 10:36 AM   Modules accepted: Orders

## 2022-11-14 NOTE — Telephone Encounter (Signed)
Prescription Request  11/14/2022  LOV: 09/17/2022  What is the name of the medication or equipment? ALPRAZolam [name]) 1 MG tablet   Have you contacted your pharmacy to request a refill? No   Which pharmacy would you like this sent to?  [facility] [address] [place] - [address] [place] [address] Phone: [phone] Fax: [phone]    Patient notified that their request is being sent to the clinical staff for review and that they should receive a response within 2 business days.   Please advise at Mobile [phone] (mobile)

## 2022-11-14 NOTE — Addendum Note (Signed)
Addended by: Eual Fines on: 11/14/2022 10:11 AM   Modules accepted: Orders

## 2022-12-13 ENCOUNTER — Other Ambulatory Visit: Payer: Self-pay | Admitting: Internal Medicine

## 2022-12-13 NOTE — Telephone Encounter (Signed)
Prescription Request  12/13/2022  LOV: 09/17/2022  What is the name of the medication or equipment? ondansetron (ZOFRAN) 4 MG tablet & ALPRAZolam (XANAX) 1 MG tablet   Have you contacted your pharmacy to request a refill? No   Which pharmacy would you like this sent to?  [facility] [address] [place] - [address] [place] [address] Phone: [phone] Fax: [phone]    Patient notified that their request is being sent to the clinical staff for review and that they should receive a response within 2 business days.   Please advise at Mobile [phone] (mobile)

## 2022-12-17 MED ORDER — ALPRAZOLAM 1 MG PO TABS: 1.0000 mg | ORAL_TABLET | Freq: Two times a day (BID) | ORAL | 0 refills | Status: DC | PRN

## 2022-12-17 MED ORDER — ONDANSETRON HCL 4 MG PO TABS: 4.0000 mg | ORAL_TABLET | Freq: Three times a day (TID) | ORAL | 0 refills | Status: DC | PRN

## 2022-12-17 NOTE — Telephone Encounter (Signed)
Patient contacted the office regarding this request, would like an update. Patient is out of alprazolam. Please advise, thank you

## 2022-12-17 NOTE — Telephone Encounter (Signed)
Zofran last filled 07/29/22 #30 tab/ 0 refill,  Xanax last filled 11/14/22 #60 tab/ 0 refill  F/u scheduled 03/19/23

## 2022-12-17 NOTE — Addendum Note (Signed)
Addended by: Shon Millet on: 12/17/2022 10:11 AM   Modules accepted: Orders

## 2023-01-16 ENCOUNTER — Other Ambulatory Visit: Payer: Self-pay | Admitting: Internal Medicine

## 2023-01-16 MED ORDER — ALPRAZOLAM 1 MG PO TABS: 1.0000 mg | ORAL_TABLET | Freq: Two times a day (BID) | ORAL | 0 refills | Status: DC | PRN

## 2023-01-16 NOTE — Telephone Encounter (Signed)
Prescription Request  01/16/2023  LOV: 09/17/2022  What is the name of the medication or equipment? ALPRAZolam [name]) 1 MG tablet   Have you contacted your pharmacy to request a refill? No   Which pharmacy would you like this sent to?  [facility] [address] [place] - [address] [place] [address] Phone: [phone] Fax: [phone]    Patient notified that their request is being sent to the clinical staff for review and that they should receive a response within 2 business days.   Please advise at Mobile [phone] (mobile)

## 2023-01-16 NOTE — Addendum Note (Signed)
Addended by: Eual Fines on: 01/16/2023 12:39 PM   Modules accepted: Orders

## 2023-01-16 NOTE — Addendum Note (Signed)
Addended by: Tillman Abide I on: 01/16/2023 12:59 PM   Modules accepted: Orders

## 2023-01-16 NOTE — Telephone Encounter (Signed)
Last filled 12-17-22 #60 Last OV 09-17-22 Next OV 03-19-23 [facility]

## 2023-02-14 ENCOUNTER — Other Ambulatory Visit: Payer: Self-pay | Admitting: Internal Medicine

## 2023-02-14 MED ORDER — ALPRAZOLAM 1 MG PO TABS: 1.0000 mg | ORAL_TABLET | Freq: Two times a day (BID) | ORAL | 0 refills | Status: DC | PRN

## 2023-02-14 NOTE — Telephone Encounter (Signed)
Prescription Request  02/14/2023  LOV: 09/17/2022  What is the name of the medication or equipment? ALPRAZolam [name]) 1 MG tablet   Have you contacted your pharmacy to request a refill? Yes   Which pharmacy would you like this sent to?  [facility] [address], [place] - [address] [place] [address] Phone: [phone] Fax: [phone]    Patient notified that their request is being sent to the clinical staff for review and that they should receive a response within 2 business days.   Please advise at Mobile [phone] (mobile)

## 2023-02-18 ENCOUNTER — Ambulatory Visit (INDEPENDENT_AMBULATORY_CARE_PROVIDER_SITE_OTHER): Payer: Self-pay | Admitting: Internal Medicine

## 2023-02-18 ENCOUNTER — Encounter: Payer: Self-pay | Admitting: Internal Medicine

## 2023-02-18 VITALS — BP 124/76 | HR 76 | Temp 97.4°F | Wt 179.8 lb

## 2023-02-18 DIAGNOSIS — R3915 Urgency of urination: Secondary | ICD-10-CM | POA: Insufficient documentation

## 2023-02-18 DIAGNOSIS — J0100 Acute maxillary sinusitis, unspecified: Secondary | ICD-10-CM | POA: Insufficient documentation

## 2023-02-18 LAB — POC URINALSYSI DIPSTICK (AUTOMATED)
Bilirubin, UA: NEGATIVE
Blood, UA: 2
Glucose, UA: NEGATIVE
Ketones, UA: NEGATIVE
Leukocytes, UA: NEGATIVE
Nitrite, UA: NEGATIVE
Protein, UA: NEGATIVE
Spec Grav, UA: <=1.005 — AB (ref 1.010–1.025)
Urobilinogen, UA: 0.2 U/dL
pH, UA: 6 (ref 5.0–8.0)

## 2023-02-18 MED ORDER — AMOXICILLIN 875 MG PO TABS: 875.0000 mg | ORAL_TABLET | Freq: Two times a day (BID) | ORAL | 0 refills | Status: DC

## 2023-02-18 NOTE — Progress Notes (Signed)
Subjective:    Patient ID: Alexis Pierce, female    DOB: [birth year], 52 y.o.   MRN: [id number]  HPI Here due to head and urinary symptoms  A week ago--was getting into bed and had a dizzy spell Woke the next morning--head "felt crazy--like about to explode" Head pressure and in ears and sinuses Light bothers her eyes--even the TV (needs glasses) Has to sit down after being up for a while  Also with diarrhea since a week ago--loose and 4-5 times a day Some general abdominal pain "like everything is in a meat grinder" No blood  Has also had urinary incontinence---wetting the bed Some upper back pain Increased urinary frequency No dysuria but very urgent. No blood  Taking meclizine for the dizziness--not really helping  Using zofran for stomach--helps some  Current Outpatient Medications on File Prior to Visit  Medication Sig Dispense Refill   albuterol (VENTOLIN HFA) 108 (90 Base) MCG/ACT inhaler Inhale 2 puffs into the lungs every 6 (six) hours as needed for wheezing or shortness of breath. Use with spacer 18 g 0   ALPRAZolam (XANAX) 1 MG tablet Take 1 tablet (1 mg total) by mouth 2 (two) times daily as needed. for anxiety 60 tablet 0   FLUoxetine (PROZAC) 20 MG capsule Take 1 capsule (20 mg total) by mouth daily. 90 capsule 3   meclizine (ANTIVERT) 25 MG tablet Take 1-2 tablets (25-50 mg total) by mouth 3 (three) times daily as needed for dizziness. 60 tablet 3   naphazoline (NAPHCON) 0.1 % ophthalmic solution Place 2 drops into both eyes 4 (four) times daily as needed for irritation. 15 mL 3   ondansetron (ZOFRAN) 4 MG tablet Take 1 tablet (4 mg total) by mouth every 8 (eight) hours as needed. 30 tablet 0   pantoprazole (PROTONIX) 40 MG tablet Take 1 tablet (40 mg total) by mouth daily. 90 tablet 3   traZODone (DESYREL) 50 MG tablet Take 3-4 tablets (150-200 mg total) by mouth at bedtime as needed for sleep. 360 tablet 2   No current facility-administered medications on  file prior to visit.    Allergies  Allergen Reactions   Ibuprofen Nausea And Vomiting    Higher Doses 600mg  or more   Penicillins     Shaking, vomiting   Hydrocodone-Acetaminophen     REACTION: nausea   Oxycodone-Acetaminophen     REACTION: nausea    Past Medical History:  Diagnosis Date   Anxiety 1990   Depression 2004   Insomnia    Suicidal intent 2004   pills   Syncope and collapse    VD (venereal disease)    x 2    Past Surgical History:  Procedure Laterality Date   ABDOMINAL HYSTERECTOMY  2005   endometriosis    Family History  Problem Relation Age of Onset   Hypertension Mother    Stroke Mother        x 2   Obesity Mother    Cancer Father        bladder, throat, and prostate   Cancer Maternal Grandmother        lung    Social History   Socioeconomic History   Marital status: Married    Spouse name: Not on file   Number of children: 2   Years of education: Not on file   Highest education level: 12th grade  Occupational History   Occupation: Engineer, water    Comment:    Tobacco Use   Smoking  status: Never    Passive exposure: Past   Smokeless tobacco: Never  Vaping Use   Vaping status: Never Used  Substance and Sexual Activity   Alcohol use: No    Alcohol/week: 0.0 standard drinks of alcohol   Drug use: No   Sexual activity: Not on file  Other Topics Concern   Not on file  Social History Narrative   Patient signed designated party release form and gives Suezette Morimoto (spouse) [phone] access to medical records.   Social Determinants of Health   Financial Resource Strain: Low Risk  (09/16/2022)   Overall Financial Resource Strain (CARDIA)    Difficulty of Paying Living Expenses: Not hard at all  Food Insecurity: No Food Insecurity (09/16/2022)   Hunger Vital Sign    Worried About Running Out of Food in the Last Year: Never true    Ran Out of Food in the Last Year: Never true  Transportation Needs: No Transportation Needs  (09/16/2022)   PRAPARE - Administrator, Civil Service (Medical): No    Lack of Transportation (Non-Medical): No  Physical Activity: Insufficiently Active (09/16/2022)   Exercise Vital Sign    Days of Exercise per Week: 5 days    Minutes of Exercise per Session: 20 min  Stress: Stress Concern Present (09/16/2022)   [employer] of Occupational Health - Occupational Stress Questionnaire    Feeling of Stress : Very much  Social Connections: Moderately Integrated (09/16/2022)   Social Connection and Isolation Panel [NHANES]    Frequency of Communication with Friends and Family: More than three times a week    Frequency of Social Gatherings with Friends and Family: Twice a week    Attends Religious Services: More than 4 times per year    Active Member of [employer] or Organizations: No    Attends Engineer, structural: Not on file    Marital Status: Married  Catering manager Violence: Not on file   Review of Systems No fever Frontal head pain--to occiput Some cough--only first thing in AM (due to congestion) Congested--but not coming down or out    Objective:   Physical Exam Constitutional:      Appearance: Normal appearance.  HENT:     Head:     Comments: Maxillary tenderness    Right Ear: Tympanic membrane and ear canal normal.     Left Ear: Tympanic membrane and ear canal normal.     Mouth/Throat:     Pharynx: No oropharyngeal exudate or posterior oropharyngeal erythema.  Pulmonary:     Effort: Pulmonary effort is normal.     Breath sounds: Normal breath sounds. No wheezing or rales.  Abdominal:     General: Bowel sounds are normal.     Palpations: Abdomen is soft.     Comments: Diffuse abdominal tenderness--more lower  Musculoskeletal:     Cervical back: Neck supple.     Comments: Diffuse back tenderness--mostly lower lumbar  Lymphadenopathy:     Cervical: No cervical adenopathy.  Neurological:     Mental Status: She is alert.             Assessment & Plan:

## 2023-02-18 NOTE — Assessment & Plan Note (Signed)
Seems to be along with urinary symptoms (though urinalysis is benign) and vestibular symptoms (though meclizine is not helping) Will try amoxil 875 bid x 7 days Should help if urinalysis negative cystitis as well

## 2023-02-18 NOTE — Assessment & Plan Note (Signed)
Urinalysis negative Will treat sinus with amoxil--should work if slight cystitis

## 2023-02-21 ENCOUNTER — Telehealth: Payer: Self-pay | Admitting: Internal Medicine

## 2023-02-21 MED ORDER — DOXYCYCLINE HYCLATE 100 MG PO TABS: 100.0000 mg | ORAL_TABLET | Freq: Two times a day (BID) | ORAL | 0 refills | Status: DC

## 2023-02-21 NOTE — Telephone Encounter (Signed)
Please let her know that I sent a stronger antibiotic for her to try

## 2023-02-21 NOTE — Telephone Encounter (Signed)
Spoke to pt

## 2023-02-21 NOTE — Telephone Encounter (Signed)
Patient was seen on Tuesday and informed to call with an update on how she is feeling. She stated that she is still not feeling any better. She stated that she is still has headache, dizziness, sinus issues, and some stomach pain still. She would like to know what her next steps are. Thank you!

## 2023-02-21 NOTE — Addendum Note (Signed)
Addended by: Tillman Abide I on: 02/21/2023 02:19 PM   Modules accepted: Orders

## 2023-02-24 ENCOUNTER — Telehealth: Payer: Self-pay | Admitting: Internal Medicine

## 2023-02-24 MED ORDER — ONDANSETRON HCL 4 MG PO TABS: 4.0000 mg | ORAL_TABLET | Freq: Three times a day (TID) | ORAL | 0 refills | Status: DC | PRN

## 2023-02-24 MED ORDER — MECLIZINE HCL 25 MG PO TABS: 25.0000 mg | ORAL_TABLET | Freq: Three times a day (TID) | ORAL | 0 refills | Status: DC | PRN

## 2023-02-24 NOTE — Addendum Note (Signed)
Addended by: Eual Fines on: 02/24/2023 10:52 AM   Modules accepted: Orders

## 2023-02-24 NOTE — Telephone Encounter (Signed)
Rxs sent electronically.  

## 2023-02-24 NOTE — Telephone Encounter (Signed)
Last given in 2020 ok to refill?

## 2023-02-24 NOTE — Telephone Encounter (Signed)
Prescription Request  02/24/2023  LOV: 02/18/2023  What is the name of the medication or equipment? meclizine (ANTIVERT) 25 MG tablet  & ondansetron (ZOFRAN) 4 MG tablet   Have you contacted your pharmacy to request a refill? No   Which pharmacy would you like this sent to?  [facility] [address] [place] - [address] [place] [address] Phone: [phone] Fax: [phone]    Patient notified that their request is being sent to the clinical staff for review and that they should receive a response within 2 business days.   Please advise at [phone]

## 2023-03-17 ENCOUNTER — Other Ambulatory Visit: Payer: Self-pay | Admitting: Internal Medicine

## 2023-03-17 MED ORDER — ALPRAZOLAM 1 MG PO TABS: 1.0000 mg | ORAL_TABLET | Freq: Two times a day (BID) | ORAL | 0 refills | Status: DC | PRN

## 2023-03-17 NOTE — Telephone Encounter (Signed)
Prescription Request  03/17/2023  LOV: 02/18/2023  What is the name of the medication or equipment? ALPRAZolam [name]) 1 MG tablet   Have you contacted your pharmacy to request a refill? No   Which pharmacy would you like this sent to?  [facility] [address], [place] - [address] [place] [address] Phone: [phone] Fax: [phone]    Patient notified that their request is being sent to the clinical staff for review and that they should receive a response within 2 business days.   Please advise at Mobile [phone] (mobile)

## 2023-03-17 NOTE — Telephone Encounter (Signed)
Last filled 02-14-23 #60 Last OV 02-18-23 Next OV 03-19-23 [facility] [address]

## 2023-03-19 ENCOUNTER — Encounter: Payer: Self-pay | Admitting: Internal Medicine

## 2023-03-19 ENCOUNTER — Ambulatory Visit (INDEPENDENT_AMBULATORY_CARE_PROVIDER_SITE_OTHER): Payer: Self-pay | Admitting: Internal Medicine

## 2023-03-19 VITALS — BP 102/80 | HR 76 | Temp 98.7°F | Ht 63.0 in | Wt 179.0 lb

## 2023-03-19 DIAGNOSIS — F39 Unspecified mood [affective] disorder: Secondary | ICD-10-CM

## 2023-03-19 DIAGNOSIS — J0101 Acute recurrent maxillary sinusitis: Secondary | ICD-10-CM

## 2023-03-19 MED ORDER — DOXYCYCLINE HYCLATE 100 MG PO TABS: 100.0000 mg | ORAL_TABLET | Freq: Two times a day (BID) | ORAL | 0 refills | Status: DC

## 2023-03-19 NOTE — Progress Notes (Signed)
Subjective:    Patient ID: Alexis Pierce, female    DOB: 03-06-1971, 52 y.o.   MRN: [id number]  HPI Here for follow up of chronic mood issues  Same issues Still has husband's family boarders "I don't even care anymore---whatever" Did speak to husband and his parents--and she is not going to do for them anymore No longer cooking for them--does clean house but not their stuff Husband goes to work and stays from 5AM to 8PM---and he just spends weekends in bed (so he can ignore his parents there)  Is more tolerant of the situation--"I just ignore them" Her mood is better Still on the fluoxetine  Still having sinus symptoms from last month Ears are plugged up Taking some elderberry cold/allergy med Amoxil didn't help-but doxy did (not totally though)  Current Outpatient Medications on File Prior to Visit  Medication Sig Dispense Refill   albuterol (VENTOLIN HFA) 108 (90 Base) MCG/ACT inhaler Inhale 2 puffs into the lungs every 6 (six) hours as needed for wheezing or shortness of breath. Use with spacer 18 g 0   ALPRAZolam (XANAX) 1 MG tablet Take 1 tablet (1 mg total) by mouth 2 (two) times daily as needed. for anxiety 60 tablet 0   FLUoxetine (PROZAC) 20 MG capsule Take 1 capsule (20 mg total) by mouth daily. 90 capsule 3   meclizine (ANTIVERT) 25 MG tablet Take 1-2 tablets (25-50 mg total) by mouth 3 (three) times daily as needed for dizziness. 60 tablet 0   naphazoline (NAPHCON) 0.1 % ophthalmic solution Place 2 drops into both eyes 4 (four) times daily as needed for irritation. 15 mL 3   ondansetron (ZOFRAN) 4 MG tablet Take 1 tablet (4 mg total) by mouth every 8 (eight) hours as needed. 30 tablet 0   pantoprazole (PROTONIX) 40 MG tablet Take 1 tablet (40 mg total) by mouth daily. 90 tablet 3   traZODone (DESYREL) 50 MG tablet Take 3-4 tablets (150-200 mg total) by mouth at bedtime as needed for sleep. 360 tablet 2   No current facility-administered medications on file prior to  visit.    Allergies  Allergen Reactions   Ibuprofen Nausea And Vomiting    Higher Doses 600mg  or more   Hydrocodone-Acetaminophen     REACTION: nausea   Oxycodone-Acetaminophen     REACTION: nausea    Past Medical History:  Diagnosis Date   Anxiety 1990   Depression 2004   Insomnia    Suicidal intent 2004   pills   Syncope and collapse    VD (venereal disease)    x 2    Past Surgical History:  Procedure Laterality Date   ABDOMINAL HYSTERECTOMY  2005   endometriosis    Family History  Problem Relation Age of Onset   Hypertension Mother    Stroke Mother        x 2   Obesity Mother    Cancer Father        bladder, throat, and prostate   Cancer Maternal Grandmother        lung    Social History   Socioeconomic History   Marital status: Married    Spouse name: Not on file   Number of children: 2   Years of education: Not on file   Highest education level: 12th grade  Occupational History   Occupation: Engineer, water    Comment:    Tobacco Use   Smoking status: Never    Passive exposure: Past  Smokeless tobacco: Never  Vaping Use   Vaping status: Never Used  Substance and Sexual Activity   Alcohol use: No    Alcohol/week: 0.0 standard drinks of alcohol   Drug use: No   Sexual activity: Not on file  Other Topics Concern   Not on file  Social History Narrative   Patient signed designated party release form and gives Sacheen Arrasmith (spouse) [phone] access to medical records.   Social Determinants of Health   Financial Resource Strain: Low Risk  (09/16/2022)   Overall Financial Resource Strain (CARDIA)    Difficulty of Paying Living Expenses: Not hard at all  Food Insecurity: No Food Insecurity (09/16/2022)   Hunger Vital Sign    Worried About Running Out of Food in the Last Year: Never true    Ran Out of Food in the Last Year: Never true  Transportation Needs: No Transportation Needs (09/16/2022)   PRAPARE - Scientist, research (physical sciences) (Medical): No    Lack of Transportation (Non-Medical): No  Physical Activity: Insufficiently Active (09/16/2022)   Exercise Vital Sign    Days of Exercise per Week: 5 days    Minutes of Exercise per Session: 20 min  Stress: Stress Concern Present (09/16/2022)   [employer] of Occupational Health - Occupational Stress Questionnaire    Feeling of Stress : Very much  Social Connections: Moderately Integrated (09/16/2022)   Social Connection and Isolation Panel [NHANES]    Frequency of Communication with Friends and Family: More than three times a week    Frequency of Social Gatherings with Friends and Family: Twice a week    Attends Religious Services: More than 4 times per year    Active Member of [employer] or Organizations: No    Attends Engineer, structural: Not on file    Marital Status: Married  Catering manager Violence: Not on file   Review of Systems     Objective:   Physical Exam Constitutional:      Appearance: Normal appearance.  HENT:     Head:     Comments: Mild maxillary tenderness    Right Ear: Tympanic membrane and ear canal normal.     Left Ear: Tympanic membrane and ear canal normal.     Mouth/Throat:     Pharynx: No oropharyngeal exudate or posterior oropharyngeal erythema.  Pulmonary:     Effort: Pulmonary effort is normal.     Breath sounds: Normal breath sounds. No wheezing or rales.  Musculoskeletal:     Cervical back: Neck supple.  Lymphadenopathy:     Cervical: No cervical adenopathy.  Neurological:     Mental Status: She is alert.            Assessment & Plan:

## 2023-03-19 NOTE — Assessment & Plan Note (Signed)
Mostly reactive now Doing better now that she is advocating for herself more Continue the fluoxetine

## 2023-03-19 NOTE — Assessment & Plan Note (Signed)
Asked her to take cetirizine at bedtime for allergy season Will try doxy again 100 bid x 10 days

## 2023-04-16 ENCOUNTER — Other Ambulatory Visit: Payer: Self-pay | Admitting: Internal Medicine

## 2023-04-16 MED ORDER — ALPRAZOLAM 1 MG PO TABS: 1.0000 mg | ORAL_TABLET | Freq: Two times a day (BID) | ORAL | 0 refills | Status: DC | PRN

## 2023-04-16 NOTE — Telephone Encounter (Signed)
Prescription Request  04/16/2023  LOV: 03/19/2023  What is the name of the medication or equipment? ALPRAZolam [name]) 1 MG tablet   Have you contacted your pharmacy to request a refill? No   Which pharmacy would you like this sent to?  [facility] [address] [place] - [address] [place] [address] Phone: [phone] Fax: [phone]    Patient notified that their request is being sent to the clinical staff for review and that they should receive a response within 2 business days.   Please advise at Mobile [phone] (mobile)

## 2023-04-16 NOTE — Addendum Note (Signed)
Addended by: Tillman Abide I on: 04/16/2023 12:48 PM   Modules accepted: Orders

## 2023-04-16 NOTE — Telephone Encounter (Signed)
Last filled 03-17-23 #60 Last OV 03-19-23 Next OV 09-17-23 [facility] [address]

## 2023-04-16 NOTE — Addendum Note (Signed)
Addended by: Eual Fines on: 04/16/2023 09:36 AM   Modules accepted: Orders

## 2023-05-02 ENCOUNTER — Other Ambulatory Visit: Payer: Self-pay | Admitting: Internal Medicine

## 2023-05-14 ENCOUNTER — Other Ambulatory Visit: Payer: Self-pay | Admitting: Internal Medicine

## 2023-05-14 MED ORDER — ALPRAZOLAM 1 MG PO TABS: 1.0000 mg | ORAL_TABLET | Freq: Two times a day (BID) | ORAL | 0 refills | Status: DC | PRN

## 2023-05-14 NOTE — Telephone Encounter (Signed)
Prescription Request  05/14/2023  LOV: 03/19/2023  What is the name of the medication or equipment? ALPRAZolam [name]) 1 MG tablet   Have you contacted your pharmacy to request a refill? Yes   Which pharmacy would you like this sent to?  [facility] [address], [place] - [address] [place] [address] Phone: [phone] Fax: [phone]    Patient notified that their request is being sent to the clinical staff for review and that they should receive a response within 2 business days.   Please advise at [facility] [phone]

## 2023-05-14 NOTE — Telephone Encounter (Signed)
Last filled 04-16-23 #60 Last OV 03-19-23 Next OV 09-17-23 [facility] [address]

## 2023-06-04 ENCOUNTER — Other Ambulatory Visit: Payer: Self-pay | Admitting: Internal Medicine

## 2023-06-04 MED ORDER — ONDANSETRON HCL 4 MG PO TABS: 4.0000 mg | ORAL_TABLET | Freq: Three times a day (TID) | ORAL | 1 refills | Status: DC | PRN

## 2023-06-04 NOTE — Telephone Encounter (Signed)
Copied from CRM [id number]. Topic: Clinical - Medication Refill >> Jun 04, 2023  1:22 PM [name] wrote: Most Recent Primary Care Visit:  Provider: Tillman Abide I  Department: [facility]  Visit Type: OFFICE VISIT  Date: 03/19/2023  Medication: ondansetron (ZOFRAN  Has the patient contacted their pharmacy? No (Agent: If no, request that the patient contact the pharmacy for the refill. If patient does not wish to contact the pharmacy document the reason why and proceed with request.) (Agent: If yes, when and what did the pharmacy advise?)  Is this the correct pharmacy for this prescription? Yes If no, delete pharmacy and type the correct one.  This is the patient's preferred pharmacy:  [facility] [address] [place] - [address] [place] [address] Phone: [phone] Fax: [phone]   Has the prescription been filled recently? No  Is the patient out of the medication? Yes  Has the patient been seen for an appointment in the last year OR does the patient have an upcoming appointment? Yes  Can we respond through MyChart? Yes  Agent: Please be advised that Rx refills may take up to 3 business days. We ask that you follow-up with your pharmacy.

## 2023-06-13 ENCOUNTER — Other Ambulatory Visit: Payer: Self-pay

## 2023-06-13 ENCOUNTER — Ambulatory Visit (INDEPENDENT_AMBULATORY_CARE_PROVIDER_SITE_OTHER): Payer: Self-pay | Admitting: Internal Medicine

## 2023-06-13 VITALS — BP 114/80 | HR 74 | Temp 98.3°F | Ht 63.0 in | Wt 177.0 lb

## 2023-06-13 DIAGNOSIS — R051 Acute cough: Secondary | ICD-10-CM

## 2023-06-13 DIAGNOSIS — J0100 Acute maxillary sinusitis, unspecified: Secondary | ICD-10-CM

## 2023-06-13 LAB — POC COVID19 BINAXNOW: SARS Coronavirus 2 Ag: NEGATIVE

## 2023-06-13 MED ORDER — DOXYCYCLINE HYCLATE 100 MG PO TABS: 100.0000 mg | ORAL_TABLET | Freq: Two times a day (BID) | ORAL | 1 refills | Status: DC

## 2023-06-13 MED ORDER — ALPRAZOLAM 1 MG PO TABS: 1.0000 mg | ORAL_TABLET | Freq: Two times a day (BID) | ORAL | 0 refills | Status: DC | PRN

## 2023-06-13 NOTE — Assessment & Plan Note (Addendum)
And frontal COVID negative May be atypical infection that is going around Analgesics plus OTC DM cough syrup Will treat with doxy 100 bid x 7 days---with refill

## 2023-06-13 NOTE — Telephone Encounter (Signed)
Last filled 05-14-23 #60 Last OV 03-19-23 Next OV today for acute [facility] [address]

## 2023-06-13 NOTE — Progress Notes (Addendum)
Subjective:    Patient ID: Alexis Pierce, female    DOB: 07-11-1970, 52 y.o.   MRN: [id number]  HPI Here due to respiratory illness  Started with head congestion and headache 5 days ago Now having total body aches Sinus pain, runny nose, headache, cough--but not able to get stuff up  Awoke with bad pain in left flank--then eased up  No fever Some SOB---just from the cough Some nausea ----but no vomiting Some sore throat Ears are stopped up  Mucinex and elderberry supplement--no help  Current Outpatient Medications on File Prior to Visit  Medication Sig Dispense Refill   albuterol (VENTOLIN HFA) 108 (90 Base) MCG/ACT inhaler Inhale 2 puffs into the lungs every 6 (six) hours as needed for wheezing or shortness of breath. Use with spacer 18 g 0   FLUoxetine (PROZAC) 20 MG capsule Take 1 capsule (20 mg total) by mouth daily. 90 capsule 3   meclizine (ANTIVERT) 25 MG tablet Take 1-2 tablets (25-50 mg total) by mouth 3 (three) times daily as needed for dizziness. 60 tablet 0   naphazoline (NAPHCON) 0.1 % ophthalmic solution Place 2 drops into both eyes 4 (four) times daily as needed for irritation. 15 mL 3   ondansetron (ZOFRAN) 4 MG tablet Take 1 tablet (4 mg total) by mouth every 8 (eight) hours as needed. 30 tablet 1   pantoprazole (PROTONIX) 40 MG tablet Take 1 tablet (40 mg total) by mouth daily. 90 tablet 3   traZODone (DESYREL) 50 MG tablet TAKE 3 TO 4 TABLETS BY MOUTH AT BEDTIME AS NEEDED FOR SLEEP 360 tablet 1   No current facility-administered medications on file prior to visit.    Allergies  Allergen Reactions   Ibuprofen Nausea And Vomiting    Higher Doses 600mg  or more   Hydrocodone-Acetaminophen     REACTION: nausea   Oxycodone-Acetaminophen     REACTION: nausea    Past Medical History:  Diagnosis Date   Anxiety 1990   Depression 2004   Insomnia    Suicidal intent 2004   pills   Syncope and collapse    VD (venereal disease)    x 2    Past Surgical  History:  Procedure Laterality Date   ABDOMINAL HYSTERECTOMY  2005   endometriosis    Family History  Problem Relation Age of Onset   Hypertension Mother    Stroke Mother        x 2   Obesity Mother    Cancer Father        bladder, throat, and prostate   Cancer Maternal Grandmother        lung    Social History   Socioeconomic History   Marital status: Married    Spouse name: Not on file   Number of children: 2   Years of education: Not on file   Highest education level: 12th grade  Occupational History   Occupation: Engineer, water    Comment:    Tobacco Use   Smoking status: Never    Passive exposure: Past   Smokeless tobacco: Never  Vaping Use   Vaping status: Never Used  Substance and Sexual Activity   Alcohol use: No    Alcohol/week: 0.0 standard drinks of alcohol   Drug use: No   Sexual activity: Not on file  Other Topics Concern   Not on file  Social History Narrative   Patient signed designated party release form and gives Paulann Delucca (spouse) [phone] access to medical  records.   Social Drivers of Corporate investment banker Strain: Low Risk  (06/13/2023)   Overall Financial Resource Strain (CARDIA)    Difficulty of Paying Living Expenses: Not very hard  Food Insecurity: No Food Insecurity (06/13/2023)   Hunger Vital Sign    Worried About Running Out of Food in the Last Year: Never true    Ran Out of Food in the Last Year: Never true  Transportation Needs: No Transportation Needs (06/13/2023)   PRAPARE - Administrator, Civil Service (Medical): No    Lack of Transportation (Non-Medical): No  Physical Activity: Sufficiently Active (06/13/2023)   Exercise Vital Sign    Days of Exercise per Week: 5 days    Minutes of Exercise per Session: 30 min  Stress: Stress Concern Present (06/13/2023)   [employer] of Occupational Health - Occupational Stress Questionnaire    Feeling of Stress : Very much  Social Connections:  Moderately Integrated (06/13/2023)   Social Connection and Isolation Panel [NHANES]    Frequency of Communication with Friends and Family: More than three times a week    Frequency of Social Gatherings with Friends and Family: Once a week    Attends Religious Services: More than 4 times per year    Active Member of [employer] or Organizations: No    Attends Engineer, structural: Not on file    Marital Status: Married  Catering manager Violence: Not on file   Review of Systems Is able to eat--but has lost sense of smell (goes back to past COVID) Taste is off also Has been able to sleep--though occ awakens with cough    Objective:   Physical Exam Constitutional:      Appearance: Normal appearance.  HENT:     Head:     Comments: Frontal and maxillary tenderness    Right Ear: Tympanic membrane and ear canal normal.     Left Ear: Tympanic membrane and ear canal normal.     Mouth/Throat:     Pharynx: No oropharyngeal exudate or posterior oropharyngeal erythema.  Neck:     Comments: Mildly tender anterior cervical node (she noticed this before the other symptoms) Pulmonary:     Effort: Pulmonary effort is normal.     Breath sounds: Normal breath sounds. No wheezing or rales.  Musculoskeletal:     Cervical back: Neck supple.  Neurological:     Mental Status: She is alert.            Assessment & Plan:

## 2023-06-13 NOTE — Addendum Note (Signed)
Addended by: Eual Fines on: 06/13/2023 11:43 AM   Modules accepted: Orders

## 2023-06-13 NOTE — Telephone Encounter (Signed)
Copied from CRM [id number]. Topic: Clinical - Medication Refill >> Jun 13, 2023  8:09 AM Orinda Kenner C wrote: Most Recent Primary Care Visit:  Provider: Tillman Abide I  [facility]: [facility]  Visit Type: OFFICE VISIT  Date: 03/19/2023  Medication: ALPRAZolam [name]) 1 MG tablet   Has the patient contacted their pharmacy? No (Agent: If no, request that the patient contact the pharmacy for the refill. If patient does not wish to contact the pharmacy document the reason why and proceed with request.) (Agent: If yes, when and what did the pharmacy advise?)  Is this the correct pharmacy for this prescription? Yes If no, delete pharmacy and type the correct one.  This is the patient's preferred pharmacy:  [facility] [address], [place] - [address] [place] [address] Phone: [phone] Fax: [phone]   Has the prescription been filled recently?   Is the patient out of the medication? Yes  Has the patient been seen for an appointment in the last year OR does the patient have an upcoming appointment? Yes  Can we respond through MyChart? No, pls c/b [phone]  Agent: Please be advised that Rx refills may take up to 3 business days. We ask that you follow-up with your pharmacy.

## 2023-06-19 ENCOUNTER — Encounter: Payer: Self-pay | Admitting: Internal Medicine

## 2023-06-19 MED ORDER — AZITHROMYCIN 250 MG PO TABS: ORAL_TABLET | ORAL | 0 refills | Status: DC

## 2023-06-19 MED ORDER — PREDNISONE 20 MG PO TABS: 40.0000 mg | ORAL_TABLET | Freq: Every day | ORAL | 0 refills | Status: DC

## 2023-07-14 ENCOUNTER — Other Ambulatory Visit: Payer: Self-pay | Admitting: Internal Medicine

## 2023-07-14 MED ORDER — ALPRAZOLAM 1 MG PO TABS: 1.0000 mg | ORAL_TABLET | Freq: Two times a day (BID) | ORAL | 0 refills | Status: DC | PRN

## 2023-07-14 NOTE — Telephone Encounter (Signed)
Copied from CRM [id number]. Topic: Clinical - Medication Refill >> Jul 14, 2023  8:29 AM Ferdie Ping wrote: Most Recent Primary Care Visit:  Provider: Tillman Abide I  Department: [facility]  Visit Type: ACUTE  Date: 06/13/2023  Medication: ALPRAZolam [name]) 1 MG tablet   Has the patient contacted their pharmacy? Yes (Agent: If no, request that the patient contact the pharmacy for the refill. If patient does not wish to contact the pharmacy document the reason why and proceed with request.) (Agent: If yes, when and what did the pharmacy advise?)  Is this the correct pharmacy for this prescription? Yes If no, delete pharmacy and type the correct one.  This is the patient's preferred pharmacy:  [facility] [address], [place] - [address] [place] [address] Phone: [phone] Fax: [phone]   Has the prescription been filled recently? No  Is the patient out of the medication? Yes  Has the patient been seen for an appointment in the last year OR does the patient have an upcoming appointment? Yes  Can we respond through MyChart? Yes  Agent: Please be advised that Rx refills may take up to 3 business days. We ask that you follow-up with your pharmacy.

## 2023-07-14 NOTE — Telephone Encounter (Signed)
Last filled 06-13-23 #60 Last OV 06-13-23 Next OV 09-17-23 [facility] [address]

## 2023-07-22 ENCOUNTER — Ambulatory Visit: Payer: Self-pay | Admitting: Internal Medicine

## 2023-07-22 ENCOUNTER — Encounter: Payer: Self-pay | Admitting: Internal Medicine

## 2023-07-22 VITALS — BP 110/60 | HR 82 | Temp 98.5°F | Ht 63.0 in | Wt 172.0 lb

## 2023-07-22 DIAGNOSIS — J0141 Acute recurrent pansinusitis: Secondary | ICD-10-CM | POA: Insufficient documentation

## 2023-07-22 DIAGNOSIS — K219 Gastro-esophageal reflux disease without esophagitis: Secondary | ICD-10-CM

## 2023-07-22 LAB — CBC
HCT: 35.2 % — ABNORMAL LOW (ref 36.0–46.0)
Hemoglobin: 12 g/dL (ref 12.0–15.0)
MCHC: 34 g/dL (ref 30.0–36.0)
MCV: 91.2 fL (ref 78.0–100.0)
Platelets: 302 10*3/uL (ref 150.0–400.0)
RBC: 3.86 Mil/uL — ABNORMAL LOW (ref 3.87–5.11)
RDW: 13.4 % (ref 11.5–15.5)
WBC: 4.7 10*3/uL (ref 4.0–10.5)

## 2023-07-22 LAB — COMPREHENSIVE METABOLIC PANEL
ALT: 16 U/L (ref 0–35)
AST: 25 U/L (ref 0–37)
Albumin: 4.4 g/dL (ref 3.5–5.2)
Alkaline Phosphatase: 72 U/L (ref 39–117)
BUN: 7 mg/dL (ref 6–23)
CO2: 27 meq/L (ref 19–32)
Calcium: 9.2 mg/dL (ref 8.4–10.5)
Chloride: 104 meq/L (ref 96–112)
Creatinine, Ser: 1 mg/dL (ref 0.40–1.20)
GFR: 64.31 mL/min (ref >60.00)
Glucose, Bld: 80 mg/dL (ref 70–99)
Potassium: 4.1 meq/L (ref 3.5–5.1)
Sodium: 140 meq/L (ref 135–145)
Total Bilirubin: 0.4 mg/dL (ref 0.2–1.2)
Total Protein: 6.5 g/dL (ref 6.0–8.3)

## 2023-07-22 LAB — VITAMIN D 25 HYDROXY (VIT D DEFICIENCY, FRACTURES): VITD: 34.69 ng/mL (ref 30.00–100.00)

## 2023-07-22 MED ORDER — AMOXICILLIN-POT CLAVULANATE 875-125 MG PO TABS: 1.0000 | ORAL_TABLET | Freq: Two times a day (BID) | ORAL | 1 refills | Status: DC

## 2023-07-22 NOTE — Telephone Encounter (Signed)
Okay--will check her in office today. Will likely need CXR

## 2023-07-22 NOTE — Progress Notes (Signed)
 Subjective:    Patient ID: Alexis Pierce Gravely, female    DOB: 1970-11-17, 53 y.o.   MRN: [id number]  HPI Here due to some ongoing respiratory symptoms  Feels like she got better from the last infection Now getting worse again--over the last 10 days More than in chest--but still with head pain and sinus congestion Watery eyes, cough--mostly dry. Worse in AM and in bed  Will have some pain along the left chest---like an ache (goes away once she gets up) No fever Some sense of SOB---mostly with walking and activity  Using the mucinex --all in one and DM Also tylenol  Saw Dr Mohorn about getting implants Will need anaesthesia No teeth left on top----6 on bottom Wanted her to have vitamin D  levels m  Current Outpatient Medications on File Prior to Visit  Medication Sig Dispense Refill   albuterol  (VENTOLIN  HFA) 108 (90 Base) MCG/ACT inhaler Inhale 2 puffs into the lungs every 6 (six) hours as needed for wheezing or shortness of breath. Use with spacer 18 g 0   ALPRAZolam  (XANAX ) 1 MG tablet Take 1 tablet (1 mg total) by mouth 2 (two) times daily as needed. for anxiety 60 tablet 0   FLUoxetine  (PROZAC ) 20 MG capsule Take 1 capsule (20 mg total) by mouth daily. 90 capsule 3   meclizine  (ANTIVERT ) 25 MG tablet Take 1-2 tablets (25-50 mg total) by mouth 3 (three) times daily as needed for dizziness. 60 tablet 0   naphazoline (NAPHCON) 0.1 % ophthalmic solution Place 2 drops into both eyes 4 (four) times daily as needed for irritation. 15 mL 3   ondansetron  (ZOFRAN ) 4 MG tablet Take 1 tablet (4 mg total) by mouth every 8 (eight) hours as needed. 30 tablet 1   pantoprazole  (PROTONIX ) 40 MG tablet Take 1 tablet (40 mg total) by mouth daily. 90 tablet 3   traZODone  (DESYREL ) 50 MG tablet TAKE 3 TO 4 TABLETS BY MOUTH AT BEDTIME AS NEEDED FOR SLEEP 360 tablet 1   No current facility-administered medications on file prior to visit.    Allergies  Allergen Reactions   Ibuprofen  Nausea And  Vomiting    Higher Doses 600mg  or more   Hydrocodone-Acetaminophen     REACTION: nausea   Oxycodone-Acetaminophen     REACTION: nausea    Past Medical History:  Diagnosis Date   Anxiety 1990   Depression 2004   Insomnia    Suicidal intent 2004   pills   Syncope and collapse    VD (venereal disease)    x 2    Past Surgical History:  Procedure Laterality Date   ABDOMINAL HYSTERECTOMY  2005   endometriosis    Family History  Problem Relation Age of Onset   Hypertension Mother    Stroke Mother        x 2   Obesity Mother    Cancer Father        bladder, throat, and prostate   Cancer Maternal Grandmother        lung    Social History   Socioeconomic History   Marital status: Married    Spouse name: Not on file   Number of children: 2   Years of education: Not on file   Highest education level: 12th grade  Occupational History   Occupation: Engineer, water    Comment:    Tobacco Use   Smoking status: Never    Passive exposure: Past   Smokeless tobacco: Never  Vaping Use  Vaping status: Never Used  Substance and Sexual Activity   Alcohol use: No    Alcohol/week: 0.0 standard drinks of alcohol   Drug use: No   Sexual activity: Not on file  Other Topics Concern   Not on file  Social History Narrative   Patient signed designated party release form and gives Lanell Carpenter (spouse) [phone] access to medical records.   Social Drivers of Corporate Investment Banker Strain: Low Risk  (06/13/2023)   Overall Financial Resource Strain (CARDIA)    Difficulty of Paying Living Expenses: Not very hard  Food Insecurity: No Food Insecurity (06/13/2023)   Hunger Vital Sign    Worried About Running Out of Food in the Last Year: Never true    Ran Out of Food in the Last Year: Never true  Transportation Needs: No Transportation Needs (06/13/2023)   PRAPARE - Administrator, Civil Service (Medical): No    Lack of Transportation (Non-Medical): No   Physical Activity: Sufficiently Active (06/13/2023)   Exercise Vital Sign    Days of Exercise per Week: 5 days    Minutes of Exercise per Session: 30 min  Stress: Stress Concern Present (06/13/2023)   [employer] of Occupational Health - Occupational Stress Questionnaire    Feeling of Stress : Very much  Social Connections: Moderately Integrated (06/13/2023)   Social Connection and Isolation Panel [NHANES]    Frequency of Communication with Friends and Family: More than three times a week    Frequency of Social Gatherings with Friends and Family: Once a week    Attends Religious Services: More than 4 times per year    Active Member of [employer] or Organizations: No    Attends Engineer, Structural: Not on file    Marital Status: Married  Catering Manager Violence: Not on file   Review of Systems AM nausea---this is chronic. Does take the protonix  daily Eating okay    Objective:   Physical Exam Constitutional:      Appearance: Normal appearance.  HENT:     Head:     Comments: Maxillary > frontal tenderness    Right Ear: Tympanic membrane and ear canal normal.     Left Ear: Tympanic membrane and ear canal normal.     Mouth/Throat:     Pharynx: No oropharyngeal exudate or posterior oropharyngeal erythema.  Pulmonary:     Effort: Pulmonary effort is normal.     Breath sounds: Normal breath sounds. No wheezing or rales.  Musculoskeletal:     Cervical back: Neck supple.  Lymphadenopathy:     Cervical: No cervical adenopathy.  Neurological:     Mental Status: She is alert.            Assessment & Plan:

## 2023-07-22 NOTE — Telephone Encounter (Signed)
 Copied from CRM [id number]. Topic: Clinical - Red Word Triage >> Jul 22, 2023  8:19 AM Joanell NOVAK wrote: Red Word that prompted transfer to Nurse Triage: Pt stated that she is needing an x-ray on her chest due to the pain. When she wakes up in the morning her chest hurts to breath.   Chief Complaint: Dyspnea  Symptoms: Chest Pain, Cough, Dyspnea Frequency: Acute Pertinent Negatives: Patient denies fever Disposition: [] ED /[] Urgent Care (no appt availability in office) / [x] Appointment(In office/virtual)/ []  [facility]/ [] Home Care/ [] Refused Recommended Disposition /[] [place]/ []  Follow-up with PCP Additional Notes: AS is a 53 year old female being triaged for that is acute in nature.The patient reports chest discomfort with coughing and shortness of breath with exertion. The patient does not have any audible wheezing or stridor during telephonic triage. No current chest pain at this time. Appointment with Dr. Jimmy at 1200 today, Contacted CAL for clarification and further instruction. instructed patient to keep appointment to see him in office today.   Reason for Disposition  [1] MILD difficulty breathing (e.g., minimal/no SOB at rest, SOB with walking, pulse <100) AND [2] NEW-onset or WORSE than normal  Answer Assessment - Initial Assessment Questions 1. RESPIRATORY STATUS: Describe your breathing? (e.g., wheezing, shortness of breath, unable to speak, severe coughing)      With Exertion, Coughing   2. ONSET: When did this breathing problem begin?      A week and a half ago  3. PATTERN Does the difficult breathing come and go, or has it been constant since it started?      Constant  4. SEVERITY: How bad is your breathing? (e.g., mild, moderate, severe)    - MILD: No SOB at rest, mild SOB with walking, speaks normally in sentences, can lie down, no retractions, pulse < 100.    - MODERATE: SOB at rest, SOB with minimal exertion and prefers to sit, cannot  lie down flat, speaks in phrases, mild retractions, audible wheezing, pulse 100-120.    - SEVERE: Very SOB at rest, speaks in single words, struggling to breathe, sitting hunched forward, retractions, pulse > 120      Moderate  5. RECURRENT SYMPTOM: Have you had difficulty breathing before? If Yes, ask: When was the last time? and What happened that time?      No  6. CARDIAC HISTORY: Do you have any history of heart disease? (e.g., heart attack, angina, bypass surgery, angioplasty)      No  7. LUNG HISTORY: Do you have any history of lung disease?  (e.g., pulmonary embolus, asthma, emphysema)     No  8. CAUSE: What do you think is causing the breathing problem?      Unsure  9. OTHER SYMPTOMS: Do you have any other symptoms? (e.g., dizziness, runny nose, cough, chest pain, fever)     Left Sided Chest Pain  10. O2 SATURATION MONITOR:  Do you use an oxygen saturation monitor (pulse oximeter) at home? If Yes, ask: What is your reading (oxygen level) today? What is your usual oxygen saturation reading? (e.g., 95%)       Unsure  11. PREGNANCY: Is there any chance you are pregnant? When was your last menstrual period?       No  12. TRAVEL: Have you traveled out of the country in the last month? (e.g., travel history, exposures)       No  Protocols used: Breathing Difficulty-A-AH

## 2023-07-22 NOTE — Assessment & Plan Note (Signed)
Continues on the pantoprazole Still need zofran for regular nausea

## 2023-07-22 NOTE — Assessment & Plan Note (Signed)
Asked her to start fluticasone daily Will switch to augmentin 875 bid x 7 days (with refill) Tylenol prn

## 2023-07-23 ENCOUNTER — Encounter: Payer: Self-pay | Admitting: Internal Medicine

## 2023-07-23 ENCOUNTER — Telehealth: Payer: Self-pay

## 2023-07-23 NOTE — Telephone Encounter (Signed)
 Spoke to pt. Let her know I faxed them for her.

## 2023-07-23 NOTE — Telephone Encounter (Signed)
 Copied from CRM [id number]. Topic: Clinical - Lab/Test Results >> Jul 23, 2023  9:32 AM Lovett Ruck C wrote: Reason for CRM: patient wants to know if her lab test results can be faxed over to her oral surgeon's office- fax number [phone] [facility]

## 2023-08-05 ENCOUNTER — Other Ambulatory Visit: Payer: Self-pay | Admitting: Internal Medicine

## 2023-08-15 ENCOUNTER — Other Ambulatory Visit: Payer: Self-pay | Admitting: Internal Medicine

## 2023-08-15 MED ORDER — ALPRAZOLAM 1 MG PO TABS: 1.0000 mg | ORAL_TABLET | Freq: Two times a day (BID) | ORAL | 0 refills | Status: DC | PRN

## 2023-08-15 NOTE — Telephone Encounter (Signed)
 Last Fill: Alprazolam: 07/14/23 60 tabs/0 RF    Fluoxetine: 08/05/23- attempted to reach pharmacy to confirm receipt, no answer, provider line rang until it cut off. This RN made attempt to contact pt to advise- "call cannot be completed as dialed".  Last OV: 07/22/23 Next OV: 09/17/23  Routing to provider for review/authorization.

## 2023-08-15 NOTE — Telephone Encounter (Signed)
  Prozac refilled 08/05/23. Refills available.     Copied from CRM [id number]. Topic: Clinical - Medication Refill >> Aug 15, 2023  8:04 AM Truddie Crumble wrote: Most Recent Primary Care Visit:  Provider: Tillman Abide I  [facility]: [facility]  Visit Type: OFFICE VISIT  Date: 07/22/2023  Medication: [name] and FLUoxetine (PROZAC) 20 MG capsule  Has the patient contacted their pharmacy? Yes (Agent: If no, request that the patient contact the pharmacy for the refill. If patient does not wish to contact the pharmacy document the reason why and proceed with request.) (Agent: If yes, when and what did the pharmacy advise?)  Is this the correct pharmacy for this prescription? Yes If no, delete pharmacy and type the correct one.  This is the patient's preferred pharmacy:  [facility] [address] [place] - [address] [place] [address] Phone: [phone] Fax: [phone]   Has the prescription been filled recently? Yes  Is the patient out of the medication? Yes  Has the patient been seen for an appointment in the last year OR does the patient have an upcoming appointment? Yes  Can we respond through MyChart? Yes  Agent: Please be advised that Rx refills may take up to 3 business days. We ask that you follow-up with your pharmacy.

## 2023-08-15 NOTE — Telephone Encounter (Signed)
 Copied from CRM [id number]. Topic: Clinical - Medication Refill >> Aug 15, 2023  8:14 AM Pascal Lux wrote: Most Recent Primary Care Visit:  Provider: Tillman Abide I  Department: [facility]  Visit Type: OFFICE VISIT  Date: 07/22/2023  Medication: pantoprazole (PROTONIX) 40 MG tablet [[id number]]  Has the patient contacted their pharmacy? Yes (Agent: If no, request that the patient contact the pharmacy for the refill. If patient does not wish to contact the pharmacy document the reason why and proceed with request.) (Agent: If yes, when and what did the pharmacy advise?) call provider  Is this the correct pharmacy for this prescription? Yes If no, delete pharmacy and type the correct one.  This is the patient's preferred pharmacy:  [facility] [address] [place] - [address] [place] [address] Phone: [phone] Fax: [phone]   Has the prescription been filled recently? Yes  Is the patient out of the medication? Yes  Has the patient been seen for an appointment in the last year OR does the patient have an upcoming appointment? Yes  Can we respond through MyChart? Yes  Agent: Please be advised that Rx refills may take up to 3 business days. We ask that you follow-up with your pharmacy.

## 2023-08-15 NOTE — Telephone Encounter (Signed)
 Last filled 07-14-23 #60 Last OV 07-22-23 Next OV 09-17-23 [facility] [address]

## 2023-08-15 NOTE — Telephone Encounter (Signed)
 Copied from CRM [id number]. Topic: Clinical - Medication Refill >> Aug 15, 2023  8:04 AM Truddie Crumble wrote: Most Recent Primary Care Visit:  Provider: Tillman Abide I  [facility]: [facility]  Visit Type: OFFICE VISIT  Date: 07/22/2023  Medication: [name] and FLUoxetine (PROZAC) 20 MG capsule  Has the patient contacted their pharmacy? Yes (Agent: If no, request that the patient contact the pharmacy for the refill. If patient does not wish to contact the pharmacy document the reason why and proceed with request.) (Agent: If yes, when and what did the pharmacy advise?)  Is this the correct pharmacy for this prescription? Yes If no, delete pharmacy and type the correct one.  This is the patient's preferred pharmacy:  [facility] [address] [place] - [address] [place] [address] Phone: [phone] Fax: [phone]   Has the prescription been filled recently? Yes  Is the patient out of the medication? Yes  Has the patient been seen for an appointment in the last year OR does the patient have an upcoming appointment? Yes  Can we respond through MyChart? Yes  Agent: Please be advised that Rx refills may take up to 3 business days. We ask that you follow-up with your pharmacy.

## 2023-09-01 ENCOUNTER — Telehealth: Payer: Self-pay

## 2023-09-01 DIAGNOSIS — Z1211 Encounter for screening for malignant neoplasm of colon: Secondary | ICD-10-CM

## 2023-09-01 NOTE — Addendum Note (Signed)
 Addended by: Tillman Abide I on: 09/01/2023 01:14 PM   Modules accepted: Orders

## 2023-09-01 NOTE — Telephone Encounter (Signed)
 Copied from CRM [id number]. Topic: Referral - Question >> Sep 01, 2023 11:42 AM Kathryne Eriksson wrote: Reason for CRM: Referral Request >> Sep 01, 2023 11:43 AM Kathryne Eriksson wrote: Patient is requesting that Karie Schwalbe, MD issue another referral for a colonoscopy. Patient states he had in the past, but patient hadn't had a chance to get around to have it completed.

## 2023-09-02 NOTE — Telephone Encounter (Signed)
 Spoke to pt

## 2023-09-12 ENCOUNTER — Other Ambulatory Visit: Payer: Self-pay | Admitting: Internal Medicine

## 2023-09-12 MED ORDER — ALPRAZOLAM 1 MG PO TABS: 1.0000 mg | ORAL_TABLET | Freq: Two times a day (BID) | ORAL | 0 refills | Status: DC | PRN

## 2023-09-12 MED ORDER — ONDANSETRON HCL 4 MG PO TABS: 4.0000 mg | ORAL_TABLET | Freq: Three times a day (TID) | ORAL | 1 refills | Status: AC | PRN

## 2023-09-12 NOTE — Telephone Encounter (Signed)
 Last filled 08-15-23 #60 Last OV 07-22-23 Next OV 09-17-23 [facility] [address]

## 2023-09-12 NOTE — Telephone Encounter (Signed)
 Copied from CRM [id number]. Topic: Clinical - Medication Refill >> Sep 12, 2023  8:29 AM Cammy Copa D wrote: Most Recent Primary Care Visit:  Provider: Tillman Abide I  [facility]: [facility]  Visit Type: OFFICE VISIT  Date: 07/22/2023  Medication: ondansetron (ZOFRAN) 4 MG tablet, ALPRAZolam (XANAX) 1 MG tablet  Has the patient contacted their pharmacy? No (Agent: If no, request that the patient contact the pharmacy for the refill. If patient does not wish to contact the pharmacy document the reason why and proceed with request.) (Agent: If yes, when and what did the pharmacy advise?)  Is this the correct pharmacy for this prescription? Yes If no, delete pharmacy and type the correct one.  This is the patient's preferred pharmacy:  [facility] [address] [place] - [address] [place] [address] Phone: [phone] Fax: [phone]   Has the prescription been filled recently? Yes  Is the patient out of the medication? Yes  Has the patient been seen for an appointment in the last year OR does the patient have an upcoming appointment? Yes  Can we respond through MyChart? Yes  Agent: Please be advised that Rx refills may take up to 3 business days. We ask that you follow-up with your pharmacy.

## 2023-09-12 NOTE — Telephone Encounter (Signed)
 Copied from CRM [id number]. Topic: Clinical - Medication Refill >> Sep 12, 2023  8:26 AM Cammy Copa D wrote: Most Recent Primary Care Visit:  Provider: Tillman Abide I  [facility]: [facility]  Visit Type: OFFICE VISIT  Date: 07/22/2023  Medication: ALPRAZolam (XANAX) 1 MG tablet, ondansetron (ZOFRAN) 4 MG tablet  Has the patient contacted their pharmacy? No (Agent: If no, request that the patient contact the pharmacy for the refill. If patient does not wish to contact the pharmacy document the reason why and proceed with request.) (Agent: If yes, when and what did the pharmacy advise?)  Is this the correct pharmacy for this prescription? Yes If no, delete pharmacy and type the correct one.  This is the patient's preferred pharmacy:  [facility] [address] [place] - [address] [place] [address] Phone: [phone] Fax: [phone]   Has the prescription been filled recently? Yes  Is the patient out of the medication? Yes  Has the patient been seen for an appointment in the last year OR does the patient have an upcoming appointment? Yes  Can we respond through MyChart? Yes  Agent: Please be advised that Rx refills may take up to 3 business days. We ask that you follow-up with your pharmacy.

## 2023-09-17 ENCOUNTER — Ambulatory Visit: Payer: Self-pay | Admitting: Internal Medicine

## 2023-10-16 ENCOUNTER — Other Ambulatory Visit: Payer: Self-pay | Admitting: Internal Medicine

## 2023-10-16 NOTE — Telephone Encounter (Signed)
 Copied from CRM [id number]. Topic: Clinical - Medication Refill >> Oct 16, 2023  9:38 AM Adonis Hoot wrote: Most Recent Primary Care Visit:  Provider: Curt Dover I  Department: [facility]  Visit Type: OFFICE VISIT  Date: 07/22/2023  Medication: ALPRAZolam  (XANAX ) 1 MG tablet  Has the patient contacted their pharmacy? No (Agent: If no, request that the patient contact the pharmacy for the refill. If patient does not wish to contact the pharmacy document the reason why and proceed with request.) (Agent: If yes, when and what did the pharmacy advise?)  Is this the correct pharmacy for this prescription? Yes If no, delete pharmacy and type the correct one.  This is the patient's preferred pharmacy:  [facility] [address] [place] - [address] [place] [address] Phone: [phone] Fax: [phone]   Has the prescription been filled recently? Yes  Is the patient out of the medication? Yes  Has the patient been seen for an appointment in the last year OR does the patient have an upcoming appointment? Yes  Can we respond through MyChart? Yes  Agent: Please be advised that Rx refills may take up to 3 business days. We ask that you follow-up with your pharmacy.

## 2023-10-17 MED ORDER — ALPRAZOLAM 1 MG PO TABS: 1.0000 mg | ORAL_TABLET | Freq: Two times a day (BID) | ORAL | 0 refills | Status: DC | PRN

## 2023-10-17 NOTE — Telephone Encounter (Signed)
 Last filled 09-12-23 #60 Last OV 07-22-23 Next OV 10-21-23 [facility] [address]

## 2023-10-21 ENCOUNTER — Ambulatory Visit: Admitting: Internal Medicine

## 2023-10-23 ENCOUNTER — Ambulatory Visit (INDEPENDENT_AMBULATORY_CARE_PROVIDER_SITE_OTHER): Payer: Self-pay | Admitting: Internal Medicine

## 2023-10-23 ENCOUNTER — Encounter: Payer: Self-pay | Admitting: Internal Medicine

## 2023-10-23 VITALS — BP 110/70 | HR 85 | Temp 98.9°F | Ht 63.0 in | Wt 170.0 lb

## 2023-10-23 DIAGNOSIS — F39 Unspecified mood [affective] disorder: Secondary | ICD-10-CM

## 2023-10-23 LAB — TSH: TSH: 1.44 u[IU]/mL (ref 0.35–5.50)

## 2023-10-23 LAB — T4, FREE: Free T4: 0.69 ng/dL (ref 0.60–1.60)

## 2023-10-23 NOTE — Assessment & Plan Note (Signed)
 Has history of bipolar disorder--but seems more consistent with type 2 if at all Irritability is better Doing well with daily fluoxetine  and alprazolam 

## 2023-10-23 NOTE — Progress Notes (Signed)
 Subjective:    Patient ID: Alexis Pierce, female    DOB: February 01, 1971, 53 y.o.   MRN: [id number]  HPI Here for follow up of mood issues and other medical concerns  Doing "good" Sinuses mostly cleared up--mostly allergic AM congestion that does get better Takes benedryl in AM---not sedating  Mood okay Some bad days--will have some anger Father in law still there--not with girlfriend Mother in law just came back--but they are doing renovations so she has to go to stay with other family till going back to [place]   Relationship with husband is okay--generally better when his mom is not there  Wonders about her thyroid Heat sensitivity, etc---like daughter (with Hashimoto's)  Current Outpatient Medications on File Prior to Visit  Medication Sig Dispense Refill   albuterol  (VENTOLIN  HFA) 108 (90 Base) MCG/ACT inhaler Inhale 2 puffs into the lungs every 6 (six) hours as needed for wheezing or shortness of breath. Use with spacer 18 g 0   ALPRAZolam  (XANAX ) 1 MG tablet Take 1 tablet (1 mg total) by mouth 2 (two) times daily as needed. for anxiety 60 tablet 0   FLUoxetine  (PROZAC ) 20 MG capsule Take 1 capsule by mouth once daily 90 capsule 3   meclizine  (ANTIVERT ) 25 MG tablet Take 1-2 tablets (25-50 mg total) by mouth 3 (three) times daily as needed for dizziness. 60 tablet 0   naphazoline (NAPHCON) 0.1 % ophthalmic solution Place 2 drops into both eyes 4 (four) times daily as needed for irritation. 15 mL 3   ondansetron  (ZOFRAN ) 4 MG tablet Take 1 tablet (4 mg total) by mouth every 8 (eight) hours as needed. 30 tablet 1   pantoprazole  (PROTONIX ) 40 MG tablet Take 1 tablet by mouth once daily 90 tablet 3   traZODone  (DESYREL ) 50 MG tablet TAKE 3 TO 4 TABLETS BY MOUTH AT BEDTIME AS NEEDED FOR SLEEP 360 tablet 1   No current facility-administered medications on file prior to visit.    Allergies  Allergen Reactions   Ibuprofen  Nausea And Vomiting    Higher Doses 600mg  or more    Hydrocodone-Acetaminophen     REACTION: nausea   Oxycodone-Acetaminophen     REACTION: nausea    Past Medical History:  Diagnosis Date   Anxiety 1990   Depression 2004   Insomnia    Suicidal intent 2004   pills   Syncope and collapse    VD (venereal disease)    x 2    Past Surgical History:  Procedure Laterality Date   ABDOMINAL HYSTERECTOMY  2005   endometriosis    Family History  Problem Relation Age of Onset   Hypertension Mother    Stroke Mother        x 2   Obesity Mother    Cancer Father        bladder, throat, and prostate   Cancer Maternal Grandmother        lung    Social History   Socioeconomic History   Marital status: Married    Spouse name: Not on file   Number of children: 2   Years of education: Not on file   Highest education level: 12th grade  Occupational History   Occupation: Engineer, water    Comment:    Tobacco Use   Smoking status: Never    Passive exposure: Past   Smokeless tobacco: Never  Vaping Use   Vaping status: Never Used  Substance and Sexual Activity   Alcohol use: No  Alcohol/week: 0.0 standard drinks of alcohol   Drug use: No   Sexual activity: Not on file  Other Topics Concern   Not on file  Social History Narrative   Patient signed designated party release form and gives Maddelynn Dippolito (spouse) [phone] access to medical records.   Social Drivers of Corporate investment banker Strain: Low Risk  (06/13/2023)   Overall Financial Resource Strain (CARDIA)    Difficulty of Paying Living Expenses: Not very hard  Food Insecurity: No Food Insecurity (06/13/2023)   Hunger Vital Sign    Worried About Running Out of Food in the Last Year: Never true    Ran Out of Food in the Last Year: Never true  Transportation Needs: No Transportation Needs (06/13/2023)   PRAPARE - Administrator, Civil Service (Medical): No    Lack of Transportation (Non-Medical): No  Physical Activity: Sufficiently Active  (06/13/2023)   Exercise Vital Sign    Days of Exercise per Week: 5 days    Minutes of Exercise per Session: 30 min  Stress: Stress Concern Present (06/13/2023)   [employer] of Occupational Health - Occupational Stress Questionnaire    Feeling of Stress : Very much  Social Connections: Moderately Integrated (06/13/2023)   Social Connection and Isolation Panel [NHANES]    Frequency of Communication with Friends and Family: More than three times a week    Frequency of Social Gatherings with Friends and Family: Once a week    Attends Religious Services: More than 4 times per year    Active Member of [employer] or Organizations: No    Attends Engineer, structural: Not on file    Marital Status: Married  Catering manager Violence: Not on file   Review of Systems Sleeps fairly well Appetite "too much" Weight is drifting up---still drinking sodas     Objective:   Physical Exam Constitutional:      Appearance: Normal appearance.  Neurological:     Mental Status: She is alert.  Psychiatric:        Mood and Affect: Mood normal.        Behavior: Behavior normal.            Assessment & Plan:

## 2023-11-08 ENCOUNTER — Other Ambulatory Visit: Payer: Self-pay | Admitting: Internal Medicine

## 2023-11-17 ENCOUNTER — Encounter: Payer: Self-pay | Admitting: Internal Medicine

## 2023-11-17 ENCOUNTER — Other Ambulatory Visit: Payer: Self-pay | Admitting: Internal Medicine

## 2023-11-17 MED ORDER — ALPRAZOLAM 1 MG PO TABS: 1.0000 mg | ORAL_TABLET | Freq: Two times a day (BID) | ORAL | 0 refills | Status: DC | PRN

## 2023-11-17 NOTE — Telephone Encounter (Signed)
 Last filled 10-17-23 #60 Last OV 07-22-23 Next OV 10-21-23 [facility] [address]

## 2023-11-17 NOTE — Telephone Encounter (Signed)
 Copied from CRM [id number]. Topic: Clinical - Medication Refill >> Nov 17, 2023  8:03 AM Oddis Bench wrote: Medication: ALPRAZolam  (XANAX ) 1 MG tablet  Has the patient contacted their pharmacy? No Out of refills  This is the patient's preferred pharmacy:  [facility] [address], [place] - [address] [place] [address] Phone: [phone] Fax: [phone]  Is this the correct pharmacy for this prescription? Yes    Has the prescription been filled recently? Yes  Is the patient out of the medication? Yes  Has the patient been seen for an appointment in the last year OR does the patient have an upcoming appointment? Yes  Can we respond through MyChart? Yes  Agent: Please be advised that Rx refills may take up to 3 business days. We ask that you follow-up with your pharmacy.

## 2023-12-16 ENCOUNTER — Telehealth: Payer: Self-pay | Admitting: Internal Medicine

## 2023-12-16 MED ORDER — ALPRAZOLAM 1 MG PO TABS: 1.0000 mg | ORAL_TABLET | Freq: Two times a day (BID) | ORAL | 0 refills | Status: DC | PRN

## 2023-12-16 NOTE — Telephone Encounter (Signed)
 Copied from CRM [id number]. Topic: Clinical - Medication Refill >> Dec 16, 2023  8:03 AM Mesmerise C wrote: Medication:  ALPRAZolam  (XANAX ) 1 MG tablet   Has the patient contacted their pharmacy? Yes (Agent: If no, request that the patient contact the pharmacy for the refill. If patient does not wish to contact the pharmacy document the reason why and proceed with request.) (Agent: If yes, when and what did the pharmacy advise?) Out of refills This is the patient's preferred pharmacy:  [facility] [address] [place] - [address] [place] [address] Phone: [phone] Fax: [phone]  Is this the correct pharmacy for this prescription? Yes If no, delete pharmacy and type the correct one.   Has the prescription been filled recently? No  Is the patient out of the medication? No  Has the patient been seen for an appointment in the last year OR does the patient have an upcoming appointment? Yes  Can we respond through MyChart? Yes  Agent: Please be advised that Rx refills may take up to 3 business days. We ask that you follow-up with your pharmacy.

## 2023-12-25 ENCOUNTER — Other Ambulatory Visit: Payer: Self-pay | Admitting: Family Medicine

## 2023-12-25 DIAGNOSIS — Z1231 Encounter for screening mammogram for malignant neoplasm of breast: Secondary | ICD-10-CM

## 2024-01-09 ENCOUNTER — Inpatient Hospital Stay: Admission: RE | Admit: 2024-01-09 | Source: Ambulatory Visit

## 2024-01-09 ENCOUNTER — Ambulatory Visit

## 2024-01-16 ENCOUNTER — Other Ambulatory Visit: Payer: Self-pay | Admitting: Internal Medicine

## 2024-01-16 MED ORDER — MECLIZINE HCL 25 MG PO TABS: 25.0000 mg | ORAL_TABLET | Freq: Three times a day (TID) | ORAL | 5 refills | Status: DC | PRN

## 2024-01-16 MED ORDER — ALPRAZOLAM 1 MG PO TABS: 1.0000 mg | ORAL_TABLET | Freq: Two times a day (BID) | ORAL | 0 refills | Status: DC | PRN

## 2024-01-16 NOTE — Telephone Encounter (Signed)
 Copied from CRM [id number]. Topic: Clinical - Medication Refill >> Jan 16, 2024  8:11 AM Elle L wrote: Medication: ALPRAZolam  (XANAX ) 1 MG tablet AND meclizine  (ANTIVERT ) 25 MG tablet  Has the patient contacted their pharmacy? Yes  This is the patient's preferred pharmacy:  [facility] [address] [place] - [address] [place] [address] Phone: [phone] Fax: [phone]  Is this the correct pharmacy for this prescription? Yes  Has the prescription been filled recently? Yes  Is the patient out of the medication? Yes  Has the patient been seen for an appointment in the last year OR does the patient have an upcoming appointment? Yes  Can we respond through MyChart? Yes  Agent: Please be advised that Rx refills may take up to 3 business days. We ask that you follow-up with your pharmacy.

## 2024-01-16 NOTE — Telephone Encounter (Signed)
 Copied from CRM [id number]. Topic: Clinical - Red Word Triage >> Jan 16, 2024  8:13 AM Elle L wrote: Red Word that prompted transfer to Nurse Triage: The patient advised that she was having vertigo symptoms and was out of her meclizine  (ANTIVERT ) 25 MG tablet. I submitted the refill requests for her. However, she declined Nurse Triage for her symptoms so I am sending to the office per protocol.

## 2024-01-28 ENCOUNTER — Ambulatory Visit (INDEPENDENT_AMBULATORY_CARE_PROVIDER_SITE_OTHER): Payer: Self-pay | Admitting: Internal Medicine

## 2024-01-28 ENCOUNTER — Encounter: Payer: Self-pay | Admitting: Internal Medicine

## 2024-01-28 VITALS — BP 124/80 | HR 94 | Temp 98.7°F | Ht 63.0 in | Wt 167.0 lb

## 2024-01-28 DIAGNOSIS — Z0000 Encounter for general adult medical examination without abnormal findings: Secondary | ICD-10-CM

## 2024-01-28 DIAGNOSIS — Z1211 Encounter for screening for malignant neoplasm of colon: Secondary | ICD-10-CM

## 2024-01-28 DIAGNOSIS — F319 Bipolar disorder, unspecified: Secondary | ICD-10-CM

## 2024-01-28 DIAGNOSIS — K219 Gastro-esophageal reflux disease without esophagitis: Secondary | ICD-10-CM

## 2024-01-28 DIAGNOSIS — E559 Vitamin D deficiency, unspecified: Secondary | ICD-10-CM | POA: Insufficient documentation

## 2024-01-28 LAB — COMPREHENSIVE METABOLIC PANEL WITH GFR
ALT: 14 U/L (ref 0–35)
AST: 19 U/L (ref 0–37)
Albumin: 4.3 g/dL (ref 3.5–5.2)
Alkaline Phosphatase: 60 U/L (ref 39–117)
BUN: 11 mg/dL (ref 6–23)
CO2: 27 meq/L (ref 19–32)
Calcium: 9.2 mg/dL (ref 8.4–10.5)
Chloride: 107 meq/L (ref 96–112)
Creatinine, Ser: 0.9 mg/dL (ref 0.40–1.20)
GFR: 72.71 mL/min (ref >60.00)
Glucose, Bld: 93 mg/dL (ref 70–99)
Potassium: 3.8 meq/L (ref 3.5–5.1)
Sodium: 142 meq/L (ref 135–145)
Total Bilirubin: 0.4 mg/dL (ref 0.2–1.2)
Total Protein: 6.5 g/dL (ref 6.0–8.3)

## 2024-01-28 LAB — CBC
HCT: 35.1 % — ABNORMAL LOW (ref 36.0–46.0)
Hemoglobin: 12 g/dL (ref 12.0–15.0)
MCHC: 34.1 g/dL (ref 30.0–36.0)
MCV: 89.7 fl (ref 78.0–100.0)
Platelets: 285 K/uL (ref 150.0–400.0)
RBC: 3.92 Mil/uL (ref 3.87–5.11)
RDW: 13.2 % (ref 11.5–15.5)
WBC: 4.4 K/uL (ref 4.0–10.5)

## 2024-01-28 LAB — VITAMIN D 25 HYDROXY (VIT D DEFICIENCY, FRACTURES): VITD: 70.98 ng/mL (ref 30.00–100.00)

## 2024-01-28 NOTE — Assessment & Plan Note (Signed)
 Tends to anger/irritability--but this is controlled On the fluoxetine  and bid alprazolam  Trazodone  for sleep

## 2024-01-28 NOTE — Assessment & Plan Note (Signed)
 Discussed fitness Will order cologuard She will set up screening mammogram No pap due to hyster Prefers no immunizations--but asked her to get Td at health department

## 2024-01-28 NOTE — Patient Instructions (Signed)
 Please set up your screening mammogram (like [facility] in [place]). Go to the health department to get your tetanus booster

## 2024-01-28 NOTE — Progress Notes (Signed)
 Subjective:    Patient ID: Alexis Pierce, female    DOB: 01-23-1971, 53 y.o.   MRN: [id number]  HPI Here for physical  Ongoing problems with teeth--wants them extracted and then implants Surgeon waiting to recheck vitamin D --to assure bone strength  Mood is okay--about the same Everyday stressors---daughter having some problems Mother in law is still at cousins. Father in law is still with them---but he is out of the way No recent sig irritability or anger Some recent brain fog--no recent illness  Some heartburn Takes pantoprazole  daily No dysphagia  Current Outpatient Medications on File Prior to Visit  Medication Sig Dispense Refill   albuterol  (VENTOLIN  HFA) 108 (90 Base) MCG/ACT inhaler Inhale 2 puffs into the lungs every 6 (six) hours as needed for wheezing or shortness of breath. Use with spacer 18 g 0   ALPRAZolam  (XANAX ) 1 MG tablet Take 1 tablet (1 mg total) by mouth 2 (two) times daily as needed. for anxiety 60 tablet 0   FLUoxetine  (PROZAC ) 20 MG capsule Take 1 capsule by mouth once daily 90 capsule 3   meclizine  (ANTIVERT ) 25 MG tablet Take 1-2 tablets (25-50 mg total) by mouth 3 (three) times daily as needed for dizziness. 60 tablet 5   naphazoline (NAPHCON) 0.1 % ophthalmic solution Place 2 drops into both eyes 4 (four) times daily as needed for irritation. 15 mL 3   ondansetron  (ZOFRAN ) 4 MG tablet Take 1 tablet (4 mg total) by mouth every 8 (eight) hours as needed. 30 tablet 1   pantoprazole  (PROTONIX ) 40 MG tablet Take 1 tablet by mouth once daily 90 tablet 3   traZODone  (DESYREL ) 50 MG tablet TAKE 3 TO 4 TABLETS BY MOUTH AT BEDTIME AS NEEDED FOR SLEEP 360 tablet 3   No current facility-administered medications on file prior to visit.    Allergies  Allergen Reactions   Ibuprofen  Nausea And Vomiting    Higher Doses 600mg  or more   Hydrocodone-Acetaminophen     REACTION: nausea   Oxycodone-Acetaminophen     REACTION: nausea    Past Medical History:   Diagnosis Date   Anxiety 1990   Depression 2004   Insomnia    Suicidal intent 2004   pills   Syncope and collapse    VD (venereal disease)    x 2    Past Surgical History:  Procedure Laterality Date   ABDOMINAL HYSTERECTOMY  2005   endometriosis    Family History  Problem Relation Age of Onset   Hypertension Mother    Stroke Mother        x 2   Obesity Mother    Cancer Father        bladder, throat, and prostate   Cancer Maternal Grandmother        lung   Hashimoto's thyroiditis Daughter    Hypothyroidism Daughter     Social History   Socioeconomic History   Marital status: Married    Spouse name: Not on file   Number of children: 2   Years of education: Not on file   Highest education level: 12th grade  Occupational History   Occupation: Engineer, water    Comment:    Tobacco Use   Smoking status: Never    Passive exposure: Past   Smokeless tobacco: Never  Vaping Use   Vaping status: Never Used  Substance and Sexual Activity   Alcohol use: No    Alcohol/week: 0.0 standard drinks of alcohol   Drug use:  No   Sexual activity: Not on file  Other Topics Concern   Not on file  Social History Narrative   Patient signed designated party release form and gives Kenosha Doster (spouse) [phone] access to medical records.   Social Drivers of Corporate investment banker Strain: Low Risk  (06/13/2023)   Overall Financial Resource Strain (CARDIA)    Difficulty of Paying Living Expenses: Not very hard  Food Insecurity: No Food Insecurity (06/13/2023)   Hunger Vital Sign    Worried About Running Out of Food in the Last Year: Never true    Ran Out of Food in the Last Year: Never true  Transportation Needs: No Transportation Needs (06/13/2023)   PRAPARE - Administrator, Civil Service (Medical): No    Lack of Transportation (Non-Medical): No  Physical Activity: Sufficiently Active (06/13/2023)   Exercise Vital Sign    Days of Exercise per  Week: 5 days    Minutes of Exercise per Session: 30 min  Stress: Stress Concern Present (06/13/2023)   [employer] of Occupational Health - Occupational Stress Questionnaire    Feeling of Stress : Very much  Social Connections: Moderately Integrated (06/13/2023)   Social Connection and Isolation Panel    Frequency of Communication with Friends and Family: More than three times a week    Frequency of Social Gatherings with Friends and Family: Once a week    Attends Religious Services: More than 4 times per year    Active Member of [employer] or Organizations: No    Attends Engineer, structural: Not on file    Marital Status: Married  Catering manager Violence: Not on file   Review of Systems  Constitutional:  Negative for fatigue and unexpected weight change.       Wears seat belt  HENT:  Positive for dental problem. Negative for hearing loss and tinnitus.   Eyes:  Negative for visual disturbance.       No diplopia or unilateral vision loss   Respiratory:  Negative for cough, chest tightness and shortness of breath.   Cardiovascular:  Negative for chest pain, palpitations and leg swelling.  Gastrointestinal:  Negative for blood in stool and constipation.  Endocrine: Positive for polydipsia and polyuria.  Genitourinary:  Negative for dyspareunia, dysuria and hematuria.  Musculoskeletal:  Positive for arthralgias and back pain. Negative for joint swelling.       Mild intermittent pain---tylenol/ibuprofen/aleve help  Skin:  Negative for rash.       No suspicious lesions  Allergic/Immunologic: Positive for environmental allergies. Negative for immunocompromised state.       Satisfied with loratadine  Neurological:  Negative for syncope.       Rare dizziness--relates to vertigo--uses meclizine if bad Some headaches--ibuprofen helps  Hematological:  Negative for adenopathy. Does not bruise/bleed easily.  Psychiatric/Behavioral:  Positive for dysphoric mood. Negative for sleep  disturbance. The patient is nervous/anxious.        Objective:   Physical Exam Constitutional:      Appearance: Normal appearance.  HENT:     Mouth/Throat:     Pharynx: No oropharyngeal exudate or posterior oropharyngeal erythema.  Eyes:     Conjunctiva/sclera: Conjunctivae normal.     Pupils: Pupils are equal, round, and reactive to light.  Cardiovascular:     Rate and Rhythm: Normal rate and regular rhythm.     Pulses: Normal pulses.     Heart sounds: No murmur heard.    No gallop.  Pulmonary:  Effort: Pulmonary effort is normal.     Breath sounds: Normal breath sounds. No wheezing or rales.  Abdominal:     Palpations: Abdomen is soft.     Tenderness: There is no abdominal tenderness.  Musculoskeletal:     Cervical back: Neck supple.     Right lower leg: No edema.     Left lower leg: No edema.  Lymphadenopathy:     Cervical: No cervical adenopathy.  Skin:    Findings: No rash.  Neurological:     General: No focal deficit present.     Mental Status: She is alert and oriented to person, place, and time.  Psychiatric:        Mood and Affect: Mood normal.        Behavior: Behavior normal.            Assessment & Plan:

## 2024-01-28 NOTE — Assessment & Plan Note (Signed)
On supplements ?Recheck levels ?

## 2024-01-28 NOTE — Assessment & Plan Note (Signed)
 Doing well with the daily pantoprazole 

## 2024-01-29 ENCOUNTER — Ambulatory Visit: Payer: Self-pay | Admitting: Internal Medicine

## 2024-02-17 ENCOUNTER — Other Ambulatory Visit: Payer: Self-pay | Admitting: Internal Medicine

## 2024-02-17 LAB — COLOGUARD: COLOGUARD: NEGATIVE

## 2024-02-17 MED ORDER — ALPRAZOLAM 1 MG PO TABS: 1.0000 mg | ORAL_TABLET | Freq: Two times a day (BID) | ORAL | 0 refills | Status: DC | PRN

## 2024-02-17 NOTE — Telephone Encounter (Signed)
 Last filled 01-16-24 #60 Last OV/CPE 01-28-24 Next OV 07-30-24 [facility] [address]

## 2024-02-17 NOTE — Telephone Encounter (Unsigned)
 Copied from CRM [id number]. Topic: Clinical - Medication Refill >> Feb 17, 2024  8:07 AM Willma R wrote: Medication: ALPRAZolam  (XANAX ) 1 MG tablet  Has the patient contacted their pharmacy? Yes, call Dr  This is the patient's preferred pharmacy:  [facility] [address] [place] - [address] [place] [address] Phone: [phone] Fax: [phone]  Is this the correct pharmacy for this prescription? Yes If no, delete pharmacy and type the correct one.   Has the prescription been filled recently? No  Is the patient out of the medication? Yes  Has the patient been seen for an appointment in the last year OR does the patient have an upcoming appointment? Yes  Can we respond through MyChart? Yes  Agent: Please be advised that Rx refills may take up to 3 business days. We ask that you follow-up with your pharmacy.

## 2024-03-17 ENCOUNTER — Other Ambulatory Visit: Payer: Self-pay | Admitting: Internal Medicine

## 2024-03-17 MED ORDER — ALPRAZOLAM 1 MG PO TABS: 1.0000 mg | ORAL_TABLET | Freq: Two times a day (BID) | ORAL | 0 refills | Status: DC | PRN

## 2024-03-17 NOTE — Telephone Encounter (Unsigned)
 Copied from CRM [id number]. Topic: Clinical - Medication Refill >> Mar 17, 2024  8:34 AM Emylou G wrote: Medication: ALPRAZolam  (XANAX ) 1 MG tablet  Has the patient contacted their pharmacy? Yes (Agent: If no, request that the patient contact the pharmacy for the refill. If patient does not wish to contact the pharmacy document the reason why and proceed with request.) (Agent: If yes, when and what did the pharmacy advise?) said to call us   This is the patient's preferred pharmacy:  [facility] [address] [place] - [address] [place] [address] Phone: [phone] Fax: [phone]  Is this the correct pharmacy for this prescription? Yes If no, delete pharmacy and type the correct one.   Has the prescription been filled recently? No  Is the patient out of the medication? Yes  Has the patient been seen for an appointment in the last year OR does the patient have an upcoming appointment? Yes  Can we respond through MyChart? Yes  Agent: Please be advised that Rx refills may take up to 3 business days. We ask that you follow-up with your pharmacy.

## 2024-04-16 ENCOUNTER — Other Ambulatory Visit: Payer: Self-pay | Admitting: Internal Medicine

## 2024-04-16 MED ORDER — ALPRAZOLAM 1 MG PO TABS: 1.0000 mg | ORAL_TABLET | Freq: Two times a day (BID) | ORAL | 0 refills | Status: DC | PRN

## 2024-04-16 NOTE — Telephone Encounter (Unsigned)
 Copied from CRM #[id number]. Topic: Clinical - Medication Refill >> Apr 16, 2024  8:14 AM Larissa S wrote: Medication: ALPRAZolam  (XANAX ) 1 MG tablet  Has the patient contacted their pharmacy? Yes (Agent: If no, request that the patient contact the pharmacy for the refill. If patient does not wish to contact the pharmacy document the reason why and proceed with request.) (Agent: If yes, when and what did the pharmacy advise?)  This is the patient's preferred pharmacy:  [facility] [address], [place] - [address] [place] [address] Phone: [phone] Fax: [phone]  Is this the correct pharmacy for this prescription? Yes If no, delete pharmacy and type the correct one.   Has the prescription been filled recently? No  Is the patient out of the medication? Yes  Has the patient been seen for an appointment in the last year OR does the patient have an upcoming appointment? Yes  Can we respond through MyChart? Yes  Agent: Please be advised that Rx refills may take up to 3 business days. We ask that you follow-up with your pharmacy.

## 2024-05-17 ENCOUNTER — Telehealth: Payer: Self-pay | Admitting: Internal Medicine

## 2024-05-17 NOTE — Telephone Encounter (Unsigned)
 Copied from CRM #[id number]. Topic: Clinical - Medication Refill >> May 17, 2024  8:23 AM Burnard DEL wrote: Medication: ALPRAZolam  (XANAX ) 1 MG tablet  Has the patient contacted their pharmacy? NO (Agent: If no, request that the patient contact the pharmacy for the refill. If patient does not wish to contact the pharmacy document the reason why and proceed with request.) (Agent: If yes, when and what did the pharmacy advise?)  This is the patient's preferred pharmacy:  [facility] [address] [place] - [address] [place] [address] Phone: [phone] Fax: [phone]  Is this the correct pharmacy for this prescription? Yes If no, delete pharmacy and type the correct one.   Has the prescription been filled recently? No  Is the patient out of the medication? No  Has the patient been seen for an appointment in the last year OR does the patient have an upcoming appointment? Yes  Can we respond through MyChart? Yes  Agent: Please be advised that Rx refills may take up to 3 business days. We ask that you follow-up with your pharmacy.

## 2024-05-18 MED ORDER — ALPRAZOLAM 1 MG PO TABS: 1.0000 mg | ORAL_TABLET | Freq: Two times a day (BID) | ORAL | 0 refills | Status: DC | PRN

## 2024-05-18 NOTE — Telephone Encounter (Signed)
 Copied from CRM #[id number]. Topic: Clinical - Medication Refill >> May 17, 2024  8:23 AM Burnard DEL wrote: Medication: ALPRAZolam  (XANAX ) 1 MG tablet  Has the patient contacted their pharmacy? NO (Agent: If no, request that the patient contact the pharmacy for the refill. If patient does not wish to contact the pharmacy document the reason why and proceed with request.) (Agent: If yes, when and what did the pharmacy advise?)  This is the patient's preferred pharmacy:  [facility] [address], [place] - [address] [place] [address] Phone: [phone] Fax: [phone]  Is this the correct pharmacy for this prescription? Yes If no, delete pharmacy and type the correct one.   Has the prescription been filled recently? No  Is the patient out of the medication? No  Has the patient been seen for an appointment in the last year OR does the patient have an upcoming appointment? Yes  Can we respond through MyChart? Yes  Agent: Please be advised that Rx refills may take up to 3 business days. We ask that you follow-up with your pharmacy. >> May 18, 2024 10:09 AM Franky GRADE wrote: Patient is calling to follow up on the refill request, I advised of the 3 day turn around time;however, Patient is completely out of medication and usually when she misses a couple days she becomes sick and wants to avoid that. She would like to know if there is anyway it can be called in today.

## 2024-06-16 ENCOUNTER — Telehealth: Payer: Self-pay

## 2024-06-16 ENCOUNTER — Other Ambulatory Visit: Payer: Self-pay | Admitting: Internal Medicine

## 2024-06-16 NOTE — Telephone Encounter (Signed)
 Copied from CRM #[id number]. Topic: Clinical - Prescription Issue >> Jun 16, 2024  8:14 AM Aleatha C wrote: Reason for CRM: Patient is down to 1 pill of her ALPRAZolam  (XANAX ) 1 MG tablet and needs it to be filled today or she will be sick, please call her once filled Call back # Mobile [phone]

## 2024-06-16 NOTE — Telephone Encounter (Signed)
 I copied this note to the message already open about the refill.

## 2024-06-16 NOTE — Telephone Encounter (Signed)
 Last filled 05-18-24 #60 Last OV/CPE 01-28-24 Next OV 07-30-24 TOC with Dr Bennett [facility] [address]

## 2024-06-16 NOTE — Telephone Encounter (Signed)
 Alexis Pierce GRADE, [place]    06/16/24 12:13 PM Note Copied from CRM #[id number]. Topic: Clinical - Prescription Issue >> Jun 16, 2024  8:14 AM Alexis Pierce wrote: Reason for CRM: Patient is down to 1 pill of her ALPRAZolam  (XANAX ) 1 MG tablet and needs it to be filled today or she will be sick, please call her once filled Call back # Mobile [phone]

## 2024-06-16 NOTE — Telephone Encounter (Signed)
 Copied from CRM #[id number]. Topic: Clinical - Medication Refill >> Jun 16, 2024  8:10 AM Aleatha C wrote: Medication:  ALPRAZolam  (XANAX ) 1 MG tablet    Has the patient contacted their pharmacy? No (Agent: If no, request that the patient contact the pharmacy for the refill. If patient does not wish to contact the pharmacy document the reason why and proceed with request.) (Agent: If yes, when and what did the pharmacy advise?)  This is the patient's preferred pharmacy:  [facility] [address] [place] - [address] [place] [address] Phone: [phone] Fax: [phone]  Is this the correct pharmacy for this prescription? Yes If no, delete pharmacy and type the correct one.   Has the prescription been filled recently? No  Is the patient out of the medication? yes  Has the patient been seen for an appointment in the last year OR does the patient have an upcoming appointment? Yes  Can we respond through MyChart? No  Agent: Please be advised that Rx refills may take up to 3 business days. We ask that you follow-up with your pharmacy.

## 2024-06-18 ENCOUNTER — Other Ambulatory Visit: Payer: Self-pay

## 2024-06-18 NOTE — Telephone Encounter (Signed)
 This encounter was created in error - please disregard.

## 2024-06-18 NOTE — Telephone Encounter (Deleted)
 Copied from CRM #[id number]. Topic: Clinical - Prescription Issue >> Jun 18, 2024  8:19 AM Rea BROCKS wrote: Reason for CRM:  Patient is down to none of her ALPRAZolam  (XANAX ) 1 MG tablet and needs it to be filled today, if possible. She put a request previously, two days ago, she will be sick, please call her once filled Call back # Mobile [phone]   [facility] [address], [place] - [address] [place] [address] Phone: [phone] Fax: [phone] Hours: Not open 24 hours

## 2024-06-18 NOTE — Telephone Encounter (Unsigned)
 Copied from CRM #[id number]. Topic: Clinical - Prescription Issue >> Jun 18, 2024  8:19 AM Rea BROCKS wrote: Reason for CRM:  Patient is down to none of her ALPRAZolam  (XANAX ) 1 MG tablet and needs it to be filled today, if possible. She put a request previously, two days ago, she will be sick, please call her once filled Call back # Mobile [phone]   [facility] [address] [place], [place] - [address] [place] [address] Phone: [phone] Fax: [phone] Hours: Not open 24 hours

## 2024-06-18 NOTE — Telephone Encounter (Signed)
 Copied from CRM #[id number]. Topic: Clinical - Medication Refill >> Jun 18, 2024  8:21 AM Rea C wrote: Medication: meclizine  (ANTIVERT ) 25 MG tablet, traZODone  (DESYREL ) 50 MG tablet  Has the patient contacted their pharmacy? Yes (Agent: If no, request that the patient contact the pharmacy for the refill. If patient does not wish to contact the pharmacy document the reason why and proceed with request.) (Agent: If yes, when and what did the pharmacy advise?)  This is the patient's preferred pharmacy:  [facility] [address] [place] - [address] [place] [address] Phone: [phone] Fax: [phone]  Is this the correct pharmacy for this prescription? Yes If no, delete pharmacy and type the correct one.   Has the prescription been filled recently? Yes  Is the patient out of the medication? Yes  Has the patient been seen for an appointment in the last year OR does the patient have an upcoming appointment? Yes  Can we respond through MyChart? Yes  Agent: Please be advised that Rx refills may take up to 3 business days. We ask that you follow-up with your pharmacy.

## 2024-06-19 MED ORDER — TRAZODONE HCL 50 MG PO TABS: 150.0000 mg | ORAL_TABLET | Freq: Every day | ORAL | 1 refills | Status: AC

## 2024-06-19 MED ORDER — MECLIZINE HCL 25 MG PO TABS: 25.0000 mg | ORAL_TABLET | Freq: Three times a day (TID) | ORAL | 1 refills | Status: AC | PRN

## 2024-06-19 MED ORDER — ALPRAZOLAM 1 MG PO TABS: 1.0000 mg | ORAL_TABLET | Freq: Two times a day (BID) | ORAL | 0 refills | Status: DC | PRN

## 2024-07-16 ENCOUNTER — Telehealth: Payer: Self-pay

## 2024-07-16 DIAGNOSIS — K219 Gastro-esophageal reflux disease without esophagitis: Secondary | ICD-10-CM

## 2024-07-16 DIAGNOSIS — F39 Unspecified mood [affective] disorder: Secondary | ICD-10-CM

## 2024-07-16 NOTE — Telephone Encounter (Signed)
 Copied from CRM [id number]. Topic: Clinical - Medication Refill >> Jul 16, 2024 11:50 AM Kendralyn S wrote: Medication:  pantoprazole  (PROTONIX ) 40 MG tablet ALPRAZolam  (XANAX ) 1 MG tablet   Has the patient contacted their pharmacy? Yes (Agent: If no, request that the patient contact the pharmacy for the refill. If patient does not wish to contact the pharmacy document the reason why and proceed with request.) (Agent: If yes, when and what did the pharmacy advise?)  This is the patient's preferred pharmacy:  [facility] [address] [place] - [address] [place] [address] Phone: [phone] Fax: [phone]  Is this the correct pharmacy for this prescription? Yes If no, delete pharmacy and type the correct one.   Has the prescription been filled recently? no  Is the patient out of the medication? No  Has the patient been seen for an appointment in the last year OR does the patient have an upcoming appointment? Yes  Can we respond through MyChart? Yes  Agent: Please be advised that Rx refills may take up to 3 business days. We ask that you follow-up with your pharmacy.

## 2024-07-16 NOTE — Telephone Encounter (Signed)
 Name of Medication:  Alprazolam  Name of Pharmacy:  [facility] [address] Last Fill or Written Date and Quantity:  06/19/24, #60 Last Office Visit and Type:  01/28/24, annual exam Next Office Visit and Type:  07/30/24, TOC/6 mo f/u (w/ Dr Bennett) Last Controlled Substance Agreement Date:  05/29/15 Last UDS:  05/29/15  Pantoprazole  last rx:  08/05/23, #90/3 refills

## 2024-07-19 MED ORDER — PANTOPRAZOLE SODIUM 40 MG PO TBEC: 40.0000 mg | DELAYED_RELEASE_TABLET | Freq: Every day | ORAL | 1 refills | Status: AC

## 2024-07-19 MED ORDER — ALPRAZOLAM 1 MG PO TABS: 1.0000 mg | ORAL_TABLET | Freq: Two times a day (BID) | ORAL | 0 refills | Status: AC | PRN
